# Patient Record
Sex: Male | Born: 1985 | Race: Black or African American | Hispanic: No | Smoking: Former smoker
Health system: Southern US, Community
[De-identification: ages and names within clinical notes are randomized; demographics above are authoritative.]

## PROBLEM LIST (undated history)

## (undated) DIAGNOSIS — B20 Human immunodeficiency virus [HIV] disease: Secondary | ICD-10-CM

## (undated) DIAGNOSIS — Z21 Asymptomatic human immunodeficiency virus [HIV] infection status: Secondary | ICD-10-CM

## (undated) HISTORY — DX: Human immunodeficiency virus (HIV) disease: B20

## (undated) HISTORY — DX: Asymptomatic human immunodeficiency virus (hiv) infection status: Z21

---

## 2014-08-04 DIAGNOSIS — K6282 Dysplasia of anus: Secondary | ICD-10-CM | POA: Insufficient documentation

## 2016-08-30 ENCOUNTER — Telehealth: Payer: Self-pay | Admitting: *Deleted

## 2016-08-30 NOTE — Telephone Encounter (Signed)
Patient has a scheduled appointment, however he is running low on meds and wanted to know what his options were (this was left on voice mail). Called patient back and left him a voicemail advising that he call his previous provider and request a short supply of his meds. Also advised him that I would route his message to our intake coordinator, Craig Morrowammy King, Craig Shah. Call back # [phone]. Craig MolaJacqueline Kellianne Shah

## 2016-09-18 ENCOUNTER — Other Ambulatory Visit (HOSPITAL_COMMUNITY)
Admission: RE | Admit: 2016-09-18 | Discharge: 2016-09-18 | Disposition: A | Discharge status: Home / Self Care | Payer: 59 | Source: Ambulatory Visit | Attending: Internal Medicine | Admitting: Internal Medicine

## 2016-09-18 ENCOUNTER — Other Ambulatory Visit: Payer: Self-pay

## 2016-09-18 DIAGNOSIS — Z113 Encounter for screening for infections with a predominantly sexual mode of transmission: Secondary | ICD-10-CM

## 2016-09-18 DIAGNOSIS — Z79899 Other long term (current) drug therapy: Secondary | ICD-10-CM

## 2016-09-18 DIAGNOSIS — B20 Human immunodeficiency virus [HIV] disease: Secondary | ICD-10-CM

## 2016-09-18 LAB — CBC WITH DIFFERENTIAL/PLATELET
BASOS PCT: 0 %
Basophils Absolute: 0 cells/uL (ref 0–200)
EOS ABS: 252 {cells}/uL (ref 15–500)
Eosinophils Relative: 4 %
HEMATOCRIT: 43.7 % (ref 38.5–50.0)
HEMOGLOBIN: 14.8 g/dL (ref 13.2–17.1)
LYMPHS PCT: 41 %
Lymphs Abs: 2583 cells/uL (ref 850–3900)
MCH: 30.1 pg (ref 27.0–33.0)
MCHC: 33.9 g/dL (ref 32.0–36.0)
MCV: 88.8 fL (ref 80.0–100.0)
MONO ABS: 630 {cells}/uL (ref 200–950)
MPV: 10.3 fL (ref 7.5–12.5)
Monocytes Relative: 10 %
Neutro Abs: 2835 cells/uL (ref 1500–7800)
Neutrophils Relative %: 45 %
Platelets: 185 10*3/uL (ref 140–400)
RBC: 4.92 MIL/uL (ref 4.20–5.80)
RDW: 13.4 % (ref 11.0–15.0)
WBC: 6.3 10*3/uL (ref 3.8–10.8)

## 2016-09-18 LAB — URINALYSIS
Bilirubin Urine: NEGATIVE
GLUCOSE, UA: NEGATIVE
HGB URINE DIPSTICK: NEGATIVE
KETONES UR: NEGATIVE
LEUKOCYTES UA: NEGATIVE
Nitrite: NEGATIVE
PROTEIN: NEGATIVE
Specific Gravity, Urine: 1.033 (ref 1.001–1.035)
pH: 6.5 (ref 5.0–8.0)

## 2016-09-18 LAB — COMPLETE METABOLIC PANEL WITH GFR
ALBUMIN: 4.4 g/dL (ref 3.6–5.1)
ALK PHOS: 55 U/L (ref 40–115)
ALT: 22 U/L (ref 9–46)
AST: 31 U/L (ref 10–40)
BILIRUBIN TOTAL: 0.5 mg/dL (ref 0.2–1.2)
BUN: 25 mg/dL (ref 7–25)
CALCIUM: 9.2 mg/dL (ref 8.6–10.3)
CO2: 25 mmol/L (ref 20–31)
CREATININE: 1.2 mg/dL (ref 0.60–1.35)
Chloride: 106 mmol/L (ref 98–110)
GFR, Est Non African American: 82 mL/min (ref >=60)
Glucose, Bld: 88 mg/dL (ref 65–99)
Potassium: 3.8 mmol/L (ref 3.5–5.3)
Sodium: 140 mmol/L (ref 135–146)
TOTAL PROTEIN: 7.2 g/dL (ref 6.1–8.1)

## 2016-09-18 LAB — LIPID PANEL
CHOL/HDL RATIO: 4.3 ratio (ref <5.0)
CHOLESTEROL: 119 mg/dL (ref <200)
HDL: 28 mg/dL — ABNORMAL LOW (ref >40)
LDL Cholesterol: 67 mg/dL (ref <100)
TRIGLYCERIDES: 122 mg/dL (ref <150)
VLDL: 24 mg/dL (ref <30)

## 2016-09-18 NOTE — Addendum Note (Signed)
Addended by: Mariea ClontsGREEN, Tonia Avino D on: 09/18/2016 10:14 AM   Modules accepted: Orders

## 2016-09-18 NOTE — Addendum Note (Signed)
Addended by: Mariea ClontsGREEN, Mikenzi Raysor D on: 09/18/2016 03:30 PM   Modules accepted: Orders

## 2016-09-18 NOTE — Addendum Note (Signed)
Addended by: Mariea ClontsGREEN, Marilynn Ekstein D on: 09/18/2016 05:51 PM   Modules accepted: Orders

## 2016-09-19 LAB — HEPATITIS B SURFACE ANTIBODY,QUALITATIVE: Hep B S Ab: POSITIVE — AB

## 2016-09-19 LAB — RPR: RPR: REACTIVE — AB

## 2016-09-19 LAB — HEPATITIS B CORE ANTIBODY, TOTAL: Hep B Core Total Ab: NONREACTIVE

## 2016-09-19 LAB — URINE CYTOLOGY ANCILLARY ONLY
CHLAMYDIA, DNA PROBE: NEGATIVE
Neisseria Gonorrhea: NEGATIVE

## 2016-09-19 LAB — HEPATITIS C ANTIBODY: HCV Ab: NEGATIVE

## 2016-09-19 LAB — HEPATITIS B SURFACE ANTIGEN: HEP B S AG: NEGATIVE

## 2016-09-19 LAB — RPR TITER: RPR Titer: 1:32 {titer} — AB

## 2016-09-19 LAB — HEPATITIS A ANTIBODY, TOTAL: HEP A TOTAL AB: NONREACTIVE

## 2016-09-19 LAB — HIV-1 RNA ULTRAQUANT REFLEX TO GENTYP+
HIV 1 RNA Quant: <20 copies/mL (ref <20)
HIV-1 RNA Quant, Log: <1.3 Log copies/mL (ref <1.30)

## 2016-09-19 LAB — FLUORESCENT TREPONEMAL AB(FTA)-IGG-BLD: Fluorescent Treponemal ABS: REACTIVE — AB

## 2016-09-20 LAB — QUANTIFERON TB GOLD ASSAY (BLOOD)
Interferon Gamma Release Assay: NEGATIVE
QUANTIFERON NIL VALUE: 0.04 [IU]/mL
QUANTIFERON TB AG MINUS NIL: 0.02 [IU]/mL

## 2016-09-20 LAB — T-HELPER CELL (CD4) - (RCID CLINIC ONLY)
CD4 T CELL HELPER: 38 % (ref 33–55)
CD4 T Cell Abs: 1040 /uL (ref 400–2700)

## 2016-09-21 ENCOUNTER — Telehealth: Payer: Self-pay | Admitting: *Deleted

## 2016-09-21 NOTE — Telephone Encounter (Signed)
Patient called and advised he is transferring here from [facility] and would like for out doctor to give him medication until his first visit 10/02/16. Asked the patient to call his former clinic and request refills he advised he spoke with nurse in this office that advised him he could get meds until his visit. Advised him usually not how this works but could call the doctor and our team lead and someone will give him a call back.

## 2016-09-27 LAB — HLA B*5701: HLA-B 5701 W/RFLX HLA-B HIGH: NEGATIVE

## 2016-10-02 ENCOUNTER — Other Ambulatory Visit: Payer: Self-pay | Admitting: *Deleted

## 2016-10-02 ENCOUNTER — Other Ambulatory Visit: Payer: Self-pay | Admitting: Pharmacist

## 2016-10-02 ENCOUNTER — Encounter: Payer: Self-pay | Admitting: Internal Medicine

## 2016-10-02 ENCOUNTER — Ambulatory Visit (INDEPENDENT_AMBULATORY_CARE_PROVIDER_SITE_OTHER): Payer: 59 | Admitting: Internal Medicine

## 2016-10-02 DIAGNOSIS — B20 Human immunodeficiency virus [HIV] disease: Secondary | ICD-10-CM | POA: Insufficient documentation

## 2016-10-02 DIAGNOSIS — B360 Pityriasis versicolor: Secondary | ICD-10-CM | POA: Diagnosis not present

## 2016-10-02 DIAGNOSIS — K6282 Dysplasia of anus: Secondary | ICD-10-CM

## 2016-10-02 DIAGNOSIS — A539 Syphilis, unspecified: Secondary | ICD-10-CM

## 2016-10-02 DIAGNOSIS — Z87891 Personal history of nicotine dependence: Secondary | ICD-10-CM

## 2016-10-02 MED ORDER — EMTRICITAB-RILPIVIR-TENOFOV AF 200-25-25 MG PO TABS: 1.0000 | ORAL_TABLET | Freq: Every day | ORAL | 11 refills | Status: DC

## 2016-10-02 MED ORDER — FLUCONAZOLE 100 MG PO TABS: 300.0000 mg | ORAL_TABLET | ORAL | 0 refills | Status: DC

## 2016-10-02 MED FILL — FLUCONAZOLE 100 MG TABLET: 100 | 14 days supply | Qty: 6 | Fill #0

## 2016-10-02 NOTE — Progress Notes (Signed)
Patient Active Problem List   Diagnosis Date Noted  . HIV disease (HCC) 10/02/2016    Priority: High  . Former smoker 10/02/2016  . Tinea versicolor 10/02/2016  . 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 Syphilis 10/02/2016    Patient's Medications  New Prescriptions   EMTRICITABINE-RILPIVIR-TENOFOVIR AF (ODEFSEY) 200-25-25 MG TABS TABLET    Take 1 tablet by mouth daily.  Previous Medications   No medications on file  Modified Medications   Modified Medication Previous Medication   FLUCONAZOLE (DIFLUCAN) 100 MG TABLET fluconazole (DIFLUCAN) 100 MG tablet      Take 3 tablets (300 mg total) by mouth once a week.    Take 3 tablets (300 mg total) by mouth once a week.  Discontinued Medications   EMTRICITABINE-RILPIVIR-TENOFOVIR DF (COMPLERA) 200-25-300 MG TABLET    Take 1 tablet by mouth at bedtime.    Subjective: Craig Shah is in for his initial visit to establish ongoing care for HIV infection. He was diagnosed in 2012 when he went in and asked her routine testing. He had been negative on all previous test. He believes he was infected through sex. He states that when he was younger he was quite promiscuous. He could not give me an estimate of the number of partners he's had in his lifetime. He has had both male and male partners. When he was diagnosed he made a decision to simply deal with it and [facility] and started on Complera shortly after his diagnosis. He recalls always being told that his viral load was undetectable and that he was doing well on therapy. His last viral load in February was elevated at 3349. He does not recall missing doses area his viral load done here earlier this month was back to undetectable at less than 20 with a CD4 count of 1040. Since learning of his infection he shared that information with 2 friends, including his current roommate [place]. He has not chosen to tell any family. He feels like he has good  support. He is not in a relationship currently and has not been sexually active in over one year. He has a history of previously treated syphilis but no other STDs that he is aware of. 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 He quit smoking cigarettes last year. He drinks alcohol only socially. He has smoked marijuana on occasion in the past but has not used any other drugs. He went through 2 years of college. 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 He denies any history of feeling depressed or anxious. He rarely if ever misses his Complera. However he ran out a week and a half ago and was not able to get a refill before his visit here today.  Review of Systems: Review of Systems  Constitutional: Negative for chills, diaphoresis, fever, malaise/fatigue and weight loss.  HENT: Negative for sore throat.   Respiratory: Negative for cough, sputum production and shortness of breath.   Cardiovascular: Negative for chest pain.  Gastrointestinal: Negative for diarrhea, nausea and vomiting.  Genitourinary: Negative for dysuria and frequency.  Musculoskeletal: Negative for joint pain and myalgias.  Skin: Negative for rash.  Neurological: Negative for dizziness and headaches.  Psychiatric/Behavioral: Negative for depression and substance abuse. The patient is not nervous/anxious.     Past Medical History:  Diagnosis Date  . HIV infection (HCC)  Social History  Substance Use Topics  . Smoking status: Former Smoker    Years: 8.00    Quit date: 05/12/2016  . Smokeless tobacco: Never Used  . Alcohol use Yes     Comment: occassional    Family History  Problem Relation Age of Onset  . Cancer Paternal Grandmother   . Colon cancer Paternal Grandmother     No Known Allergies  Objective:  Vitals:   10/02/16 1404  BP: 124/80  Pulse: (!) 51  Temp: 98 F (36.7 C)  TempSrc: Oral  Weight: 159 lb (72.1 kg)  Height: 5'  6" (1.676 m)   Body mass index is 25.66 kg/m.  Physical Exam  Constitutional: He is oriented to person, place, and time.  He is smiling and in good spirits.  HENT:  Mouth/Throat: No oropharyngeal exudate.  Eyes: Conjunctivae are normal.  Cardiovascular: Normal rate and regular rhythm.   No murmur heard. Pulmonary/Chest: Effort normal and breath sounds normal. He has no wheezes. He has no rales.  Abdominal: Soft. He exhibits no mass. There is no tenderness.  Musculoskeletal: Normal range of motion.  Neurological: He is alert and oriented to person, place, and time. Gait normal.  Skin: Rash noted.  He has scattered, circular areas of hypopigmentation on his arms and trunk compatible with tinea versicolor.  Psychiatric: Mood and affect normal.    Lab Results Lab Results  Component Value Date   WBC 6.3 09/18/2016   HGB 14.8 09/18/2016   HCT 43.7 09/18/2016   MCV 88.8 09/18/2016   PLT 185 09/18/2016    Lab Results  Component Value Date   CREATININE 1.20 09/18/2016   BUN 25 09/18/2016   NA 140 09/18/2016   K 3.8 09/18/2016   CL 106 09/18/2016   CO2 25 09/18/2016    Lab Results  Component Value Date   ALT 22 09/18/2016   AST 31 09/18/2016   ALKPHOS 55 09/18/2016   BILITOT 0.5 09/18/2016    Lab Results  Component Value Date   CHOL 119 09/18/2016   HDL 28 (L) 09/18/2016   LDLCALC 67 09/18/2016   TRIG 122 09/18/2016   CHOLHDL 4.3 09/18/2016   HIV 1 RNA Quant (copies/mL)  Date Value  09/18/2016 <20   CD4 T Cell Abs (/uL)  Date Value  09/18/2016 1,040     Problem List Items Addressed This Visit      High   HIV disease (HCC)    His infection is back under excellent control. His adherence is good. I will [facility]. He will follow-up after blood work in 3 months.        Unprioritized   Anal dysplasia    He has a history of anal CIN 1 and [facility]. He never made it to that appointment because he never received a call from the  clinic. We will work on making local arrangements for evaluation within the next few months.      Former smoker   Syphilis    His RPR has been consistently elevated since first checked at [facility] in 2015. He indicates that he has been treated years ago in [place] when he was a teenager. He says that he was also seen at another clinic (? Health department) within the past 2 months and was treated with 1 dose of penicillin. He recalls being told that he will always have some evidence of syphilis in his blood test. One note from [facility] in 2016 indicated that  they felt like he was probably serofast but his RPR is now up from 1:16 to 1:32. He denies being sexually active in the past year. He will have a repeat RPR before his visit in 3 months. 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      Tinea versicolor    I will treat him with oral fluconazole.      Relevant Orders   T-helper cell (CD4)- ([facility] clinic only)   HIV 1 RNA quant-no reflex-bld   CBC   Comprehensive metabolic panel   Lipid panel   RPR        Cliffton Asters, MD [facility] Services for Infectious Disease [facility] [phone] pager   [phone] cell 10/02/2016, 3:51 PM

## 2016-10-02 NOTE — Progress Notes (Signed)
HPI: Marykay LexCameron Engh is a 30 y.o. male who is here after his tx of [facility].   Allergies: No Known Allergies  Vitals: Temp: 98 F (36.7 C) (11/21 1404) Temp Source: Oral (11/21 1404) BP: 124/80 (11/21 1404) Pulse Rate: 51 (11/21 1404)  Past Medical History: Past Medical History:  Diagnosis Date  . HIV infection [facility](HCC)     Social History: Social History   Social History  . Marital status: Unknown    Spouse name: N/A  . Number of children: N/A  . Years of education: N/A   Social History Main Topics  . Smoking status: Former Smoker    Years: 8.00    Quit date: 05/12/2016  . Smokeless tobacco: Never Used  . Alcohol use Yes     Comment: occassional  . Drug use:     Frequency: 7.0 times per week    Types: Marijuana     Comment: recreational  . Sexual activity: Not Currently    Partners: Male     Comment: declined condoms   Other Topics Concern  . None   Social History Narrative  . None    Previous Regimen:   Current Regimen: Complera  Labs: HIV 1 RNA Quant (copies/mL)  Date Value  09/18/2016 <20   CD4 T Cell Abs (/uL)  Date Value  09/18/2016 1,040   Hep B S Ab (no units)  Date Value  09/18/2016 POS (A)   Hepatitis B Surface Ag (no units)  Date Value  09/18/2016 NEGATIVE   HCV Ab (no units)  Date Value  09/18/2016 NEGATIVE    CrCl: Estimated Creatinine Clearance: 82 mL/min (by C-G formula based on SCr of 1.2 mg/dL).  Lipids:    Component Value Date/Time   CHOL 119 09/18/2016 0919   TRIG 122 09/18/2016 0919   HDL 28 (L) 09/18/2016 0919   CHOLHDL 4.3 09/18/2016 0919   VLDL 24 09/18/2016 0919   LDLCALC 67 09/18/2016 0919    Assessment: Sheria LangCameron received his HIV care from [facility] in the past and is here for his initial visit today. He has been well managed on Complera. He ran out of meds about a week ago and couldn't get refill. He has private insurance now. We tried to [facility] his [facility] so we can keep track of it, however, [facility]  ins[facility]. HIs prescription has been sent there.   Recommendations:  Change [facility]  [place], [place] Quang, PharmD, BCPS, AAHIVP, CPP Clinical Infectious Disease Pharmacist [facility] 10/02/2016, 11:12 PM

## 2016-10-02 NOTE — Assessment & Plan Note (Signed)
He has a history of anal CIN 1 and was [facility]. He never made it to that appointment because he never received a call from the clinic. We will work on making local arrangements for evaluation within the next few months.

## 2016-10-02 NOTE — Assessment & Plan Note (Addendum)
I will treat him with oral fluconazole.

## 2016-10-02 NOTE — Addendum Note (Signed)
Addended by: Nicholes CalamityPHAM, MINH Q on: 10/02/2016 04:44 PM   Modules accepted: Orders

## 2016-10-02 NOTE — Assessment & Plan Note (Addendum)
His RPR has been consistently elevated since [facility]. He indicates that he has been treated years ago in [place] when he was a teenager. He says that he was also seen at another clinic (? Health department) within the past 2 months and was treated with 1 dose of penicillin. He recalls being told that he will always have some evidence of syphilis in his blood test. One note from [facility] in 2016 indicated that they felt like he was probably serofast but his RPR is now up from 1:16 to 1:32. He denies being sexually active in the past year. He will have a repeat RPR before his visit in 3 months. 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

## 2016-10-02 NOTE — Assessment & Plan Note (Signed)
His infection is back under excellent control. His adherence is good. I will change [facility] to [facility]. He will follow-up after blood work in 3 months.

## 2016-10-16 ENCOUNTER — Encounter: Payer: Self-pay | Admitting: *Deleted

## 2016-12-20 ENCOUNTER — Other Ambulatory Visit: Payer: 59

## 2016-12-20 DIAGNOSIS — B360 Pityriasis versicolor: Secondary | ICD-10-CM

## 2016-12-20 LAB — COMPREHENSIVE METABOLIC PANEL
ALK PHOS: 60 U/L (ref 40–115)
ALT: 16 U/L (ref 9–46)
AST: 23 U/L (ref 10–40)
Albumin: 4.1 g/dL (ref 3.6–5.1)
BILIRUBIN TOTAL: 0.4 mg/dL (ref 0.2–1.2)
BUN: 19 mg/dL (ref 7–25)
CALCIUM: 8.9 mg/dL (ref 8.6–10.3)
CO2: 29 mmol/L (ref 20–31)
CREATININE: 1.13 mg/dL (ref 0.60–1.35)
Chloride: 107 mmol/L (ref 98–110)
GLUCOSE: 98 mg/dL (ref 65–99)
Potassium: 4 mmol/L (ref 3.5–5.3)
SODIUM: 140 mmol/L (ref 135–146)
Total Protein: 7 g/dL (ref 6.1–8.1)

## 2016-12-20 LAB — CBC
HEMATOCRIT: 44.6 % (ref 38.5–50.0)
Hemoglobin: 15.1 g/dL (ref 13.2–17.1)
MCH: 30.3 pg (ref 27.0–33.0)
MCHC: 33.9 g/dL (ref 32.0–36.0)
MCV: 89.4 fL (ref 80.0–100.0)
MPV: 10.3 fL (ref 7.5–12.5)
PLATELETS: 202 10*3/uL (ref 140–400)
RBC: 4.99 MIL/uL (ref 4.20–5.80)
RDW: 13.5 % (ref 11.0–15.0)
WBC: 7.6 10*3/uL (ref 3.8–10.8)

## 2016-12-20 LAB — LIPID PANEL
Cholesterol: 101 mg/dL (ref <200)
HDL: 32 mg/dL — ABNORMAL LOW (ref >40)
LDL CALC: 52 mg/dL (ref <100)
Total CHOL/HDL Ratio: 3.2 Ratio (ref <5.0)
Triglycerides: 85 mg/dL (ref <150)
VLDL: 17 mg/dL (ref <30)

## 2016-12-20 MED FILL — ODEFSEY 200-25-25 MG TABS: 200-25-25 | 30 days supply | Qty: 30 | Fill #0

## 2016-12-21 LAB — T-HELPER CELL (CD4) - (RCID CLINIC ONLY)
CD4 % Helper T Cell: 34 % (ref 33–55)
CD4 T Cell Abs: 1270 /uL (ref 400–2700)

## 2016-12-21 LAB — RPR: RPR: REACTIVE — AB

## 2016-12-21 LAB — RPR TITER

## 2016-12-21 LAB — FLUORESCENT TREPONEMAL AB(FTA)-IGG-BLD: Fluorescent Treponemal ABS: REACTIVE — AB

## 2016-12-24 LAB — HIV-1 RNA QUANT-NO REFLEX-BLD
HIV 1 RNA Quant: <20 copies/mL — AB
HIV-1 RNA QUANT, LOG: DETECTED {Log_copies}/mL — AB

## 2017-01-02 ENCOUNTER — Encounter: Payer: Self-pay | Admitting: *Deleted

## 2017-01-03 ENCOUNTER — Encounter: Payer: Self-pay | Admitting: Internal Medicine

## 2017-01-03 ENCOUNTER — Ambulatory Visit (INDEPENDENT_AMBULATORY_CARE_PROVIDER_SITE_OTHER): Payer: 59 | Admitting: Internal Medicine

## 2017-01-03 DIAGNOSIS — K6282 Dysplasia of anus: Secondary | ICD-10-CM | POA: Diagnosis not present

## 2017-01-03 DIAGNOSIS — B20 Human immunodeficiency virus [HIV] disease: Secondary | ICD-10-CM

## 2017-01-03 DIAGNOSIS — A539 Syphilis, unspecified: Secondary | ICD-10-CM

## 2017-01-03 DIAGNOSIS — Z23 Encounter for immunization: Secondary | ICD-10-CM

## 2017-01-03 DIAGNOSIS — B360 Pityriasis versicolor: Secondary | ICD-10-CM | POA: Diagnosis not present

## 2017-01-03 MED ORDER — FLUCONAZOLE 100 MG PO TABS: 300.0000 mg | ORAL_TABLET | ORAL | 0 refills | Status: DC

## 2017-01-03 NOTE — Assessment & Plan Note (Signed)
His RPR is down to 1:16. 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

## 2017-01-03 NOTE — Assessment & Plan Note (Signed)
He was diagnosed with CIN-1 on an anal Pap prior to his transfer here last fall. He was [facility]. We will make a local referral for evaluation.

## 2017-01-03 NOTE — Addendum Note (Signed)
Addended by: Jennet MaduroESTRIDGE, DENISE D on: 01/03/2017 12:51 PM   Modules accepted: Orders

## 2017-01-03 NOTE — Addendum Note (Signed)
Addended by: Jennet MaduroESTRIDGE, Koren Plyler D on: 01/03/2017 02:46 PM   Modules accepted: Orders

## 2017-01-03 NOTE — Progress Notes (Signed)
Patient Active Problem List   Diagnosis Date Noted  . HIV disease (HCC) 10/02/2016    Priority: High  . Former smoker 10/02/2016  . Tinea versicolor 10/02/2016  . 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 Syphilis 10/02/2016  . 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  Modified Medications   Modified Medication Previous Medication   FLUCONAZOLE (DIFLUCAN) 100 MG TABLET fluconazole (DIFLUCAN) 100 MG tablet      Take 3 tablets (300 mg total) by mouth once a week.    Take 3 tablets (300 mg total) by mouth once a week.  Discontinued Medications   No medications on file    Subjective: Craig Shah is in for his routine HIV follow-up visit. After his initial visit last fall he switch from Complera to [facility]. He is tolerating it well. He missed 2-3 days when he had to change pharmacies but otherwise has not missed any other doses. He completed his 2 week course of fluconazole but noted only slight improvement in his tinea rash. He has not been sexually active since his last visit. He has a vacation coming up in March. He will be going to [place] for a dance competition.   Review of Systems: Review of Systems  Constitutional: Negative for chills, diaphoresis, fever, malaise/fatigue and weight loss.  HENT: Negative for sore throat.   Respiratory: Negative for cough, sputum production and shortness of breath.   Cardiovascular: Negative for chest pain.  Gastrointestinal: Negative for abdominal pain, diarrhea, heartburn, nausea and vomiting.  Genitourinary: Negative for dysuria and frequency.  Musculoskeletal: Negative for joint pain and myalgias.  Skin: Positive for rash. Negative for itching.  Neurological: Negative for dizziness and headaches.  Psychiatric/Behavioral: Negative for depression and  substance abuse. The patient is not nervous/anxious.     Past Medical History:  Diagnosis Date  . HIV infection [facility](HCC)     Social History  Substance Use Topics  . Smoking status: Former Smoker    Years: 8.00    Quit date: 05/12/2016  . Smokeless tobacco: Never Used  . Alcohol use Yes     Comment: occassional    Family History  Problem Relation Age of Onset  . Cancer Paternal Grandmother   . Colon cancer Paternal Grandmother   . Alcohol abuse Father   . Heart disease Father     No Known Allergies  Objective:  Vitals:   01/03/17 0849  BP: 118/78  Pulse: (!) 48  Temp: (!) 94.4 F (34.7 C)  TempSrc: Oral  Weight: 158 lb 8 oz (71.9 kg)  Height: 5\' 6"  (1.676 m)   Body mass index is 25.58 kg/m.  Physical Exam  Constitutional: He is oriented to person, place, and time.  He is very pleasant and in good spirits.  HENT:  Mouth/Throat: No oropharyngeal exudate.  His teeth are in excellent condition.  Eyes: Conjunctivae are normal.  Cardiovascular: Normal rate and regular rhythm.   No murmur heard. Pulmonary/Chest: Effort normal and breath sounds normal. He has no wheezes. He has no rales.  Abdominal: Soft. He exhibits no mass. There is no tenderness.  Musculoskeletal: Normal range of motion.  Neurological: He is alert and oriented to person, place, and time.  Skin: Rash noted.  There is no change in his scattered circular  slightly scaly spots on his trunk and arms.  Psychiatric: Mood and affect normal.    Lab Results Lab Results  Component Value Date   WBC 7.6 12/20/2016   HGB 15.1 12/20/2016   HCT 44.6 12/20/2016   MCV 89.4 12/20/2016   PLT 202 12/20/2016    Lab Results  Component Value Date   CREATININE 1.13 12/20/2016   BUN 19 12/20/2016   NA 140 12/20/2016   K 4.0 12/20/2016   CL 107 12/20/2016   CO2 29 12/20/2016    Lab Results  Component Value Date   ALT 16 12/20/2016   AST 23 12/20/2016   ALKPHOS 60 12/20/2016   BILITOT 0.4 12/20/2016      Lab Results  Component Value Date   CHOL 101 12/20/2016   HDL 32 (L) 12/20/2016   LDLCALC 52 12/20/2016   TRIG 85 12/20/2016   CHOLHDL 3.2 12/20/2016   HIV 1 RNA Quant (copies/mL)  Date Value  12/20/2016 <20 DETECTED (A)  09/18/2016 <20   CD4 T Cell Abs (/uL)  Date Value  12/20/2016 1,270  09/18/2016 1,040     Problem List Items Addressed This Visit      High   HIV disease (HCC)    His infection is under excellent, long-term control. He will continue Odefsey in follow-up after lab work in 6 months. He received his hepatitis A and meningococcal vaccines today.      Relevant Medications   fluconazole (DIFLUCAN) 100 MG tablet   Other Relevant Orders   T-helper cell (CD4)- ([facility] clinic only)   HIV 1 RNA quant-no reflex-bld   RPR     Unprioritized   Anal dysplasia    He was diagnosed with CIN-1 on an anal Pap prior to his transfer here last fall. He was never able to see the general surgeons at [facility]. We will make a local referral for evaluation.      Syphilis    His RPR is down to 1:16. 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      Relevant Medications   fluconazole (DIFLUCAN) 100 MG tablet   Tinea versicolor    I will retreat him with oral fluconazole and have him use Selsun Blue shampoo topically on a daily basis for 1-2 weeks.      Relevant Medications   fluconazole (DIFLUCAN) 100 MG tablet        Cliffton Asters, MD [facility] for Infectious Disease [facility] [phone] pager   [phone] cell 01/03/2017, 9:03 AM

## 2017-01-03 NOTE — Assessment & Plan Note (Signed)
I will retreat him with oral fluconazole and have him use Selsun Blue shampoo topically on a daily basis for 1-2 weeks.

## 2017-01-03 NOTE — Assessment & Plan Note (Signed)
His infection is under excellent, long-term control. He will continue Odefsey in follow-up after lab work in 6 months. He received his hepatitis A and meningococcal vaccines today.

## 2017-01-04 ENCOUNTER — Telehealth: Payer: Self-pay | Admitting: *Deleted

## 2017-01-04 ENCOUNTER — Other Ambulatory Visit: Payer: Self-pay | Admitting: *Deleted

## 2017-01-04 DIAGNOSIS — B20 Human immunodeficiency virus [HIV] disease: Secondary | ICD-10-CM

## 2017-01-04 DIAGNOSIS — B360 Pityriasis versicolor: Secondary | ICD-10-CM

## 2017-01-04 MED ORDER — FLUCONAZOLE 100 MG PO TABS: 300.0000 mg | ORAL_TABLET | ORAL | 0 refills | Status: DC

## 2017-01-04 NOTE — Telephone Encounter (Signed)
Called patient and left a voice mail to confirm pharmacy. [facility] are out of network and [facility]. [facility] sent

## 2017-01-16 MED FILL — ODEFSEY 200-25-25 MG TABS: 200-25-25 | 30 days supply | Qty: 30 | Fill #1

## 2017-02-19 MED FILL — ODEFSEY 200-25-25 MG TABS: 200-25-25 | 30 days supply | Qty: 30 | Fill #2

## 2017-03-21 MED FILL — ODEFSEY 200-25-25 MG TABS: 200-25-25 | 30 days supply | Qty: 30 | Fill #3

## 2017-04-24 MED FILL — ODEFSEY 200-25-25 MG TABS: 200-25-25 | 30 days supply | Qty: 30 | Fill #4

## 2017-05-22 MED FILL — ODEFSEY 200-25-25 MG TABS: 200-25-25 | 30 days supply | Qty: 30 | Fill #5

## 2017-06-07 ENCOUNTER — Other Ambulatory Visit: Payer: Self-pay | Admitting: Pharmacist

## 2017-06-21 MED FILL — ODEFSEY 200-25-25 MG TABS: 200-25-25 | 30 days supply | Qty: 30 | Fill #6

## 2017-07-24 MED FILL — ODEFSEY 200-25-25 MG TABS: 200-25-25 | 30 days supply | Qty: 30 | Fill #7

## 2017-07-30 ENCOUNTER — Ambulatory Visit (INDEPENDENT_AMBULATORY_CARE_PROVIDER_SITE_OTHER): Payer: 59 | Admitting: Internal Medicine

## 2017-07-30 ENCOUNTER — Encounter: Payer: Self-pay | Admitting: Internal Medicine

## 2017-07-30 DIAGNOSIS — B360 Pityriasis versicolor: Secondary | ICD-10-CM

## 2017-07-30 DIAGNOSIS — K0889 Other specified disorders of teeth and supporting structures: Secondary | ICD-10-CM | POA: Diagnosis not present

## 2017-07-30 DIAGNOSIS — B20 Human immunodeficiency virus [HIV] disease: Secondary | ICD-10-CM

## 2017-07-30 NOTE — Progress Notes (Signed)
Patient Active Problem List   Diagnosis Date Noted  . HIV disease (HCC) 10/02/2016    Priority: High  . Former smoker 10/02/2016  . Tinea versicolor 10/02/2016  . 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 Syphilis 10/02/2016  . 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   FLUCONAZOLE (DIFLUCAN) 100 MG TABLET    Take 3 tablets (300 mg total) by mouth once a week.  Modified Medications   No medications on file  Discontinued Medications   No medications on file    Subjective: Craig Shah is in for his routine HIV follow-up visit. He has had no problems obtaining, taking or tolerating his Odefsey and does not recall having missed doses. He takes it each evening before bed. 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 He is having some pain in front of his right mandibular molars recently. He does not get any regular exercise outside of work. He is not in a relationship and has not been sexually active since his last visit. He was seen by Dr. Romie Levee at [facility] surgery for his history of anal dysplasia and he tells me that he had a negative exam. He was told to follow-up as needed.  Review of Systems: Review of Systems  Constitutional: Negative for chills, diaphoresis, fever, malaise/fatigue and weight loss.  HENT: Negative for sore throat.        As noted in history of present illness.  Respiratory: Negative for cough, sputum production and shortness of breath.   Cardiovascular: Negative for chest pain.  Gastrointestinal: Negative for abdominal pain, diarrhea, heartburn, nausea and vomiting.  Genitourinary: Negative for dysuria and frequency.  Musculoskeletal: Negative for joint pain and myalgias.  Skin: Negative for rash.  Neurological: Negative  for dizziness and headaches.  Psychiatric/Behavioral: Negative for depression and substance abuse. The patient is not nervous/anxious.     Past Medical History:  Diagnosis Date  . HIV infection [facility])     Social History  Substance Use Topics  . Smoking status: Former Smoker    Years: 8.00    Quit date: 05/12/2016  . Smokeless tobacco: Never Used  . Alcohol use Yes     Comment: occassional    Family History  Problem Relation Age of Onset  . Cancer Paternal Grandmother   . Colon cancer Paternal Grandmother   . Alcohol abuse Father   . Heart disease Father     No Known Allergies  Objective:  Vitals:   07/30/17 0851  BP: 111/73  Pulse: (!) 57  Temp: 97.7 F (36.5 C)  Weight: 158 lb (71.7 kg)   Body mass index is 25.5 kg/m.  Physical Exam  Constitutional: He is oriented to person, place, and time.  He is in good spirits.  HENT:  Mouth/Throat: No oropharyngeal exudate.  No obvious dental problems.  Eyes: Conjunctivae are normal.  Cardiovascular: Normal rate and regular rhythm.   No murmur heard. Pulmonary/Chest: Effort normal and breath sounds normal.  Abdominal: Soft. He exhibits no mass. There is no tenderness.  Musculoskeletal: Normal range of motion.  Neurological: He is alert and oriented to person, place, and time.  Skin: No rash noted.  Psychiatric: Mood and affect normal.    Lab Results Lab Results  Component Value Date   WBC 7.6 12/20/2016   HGB 15.1 12/20/2016   HCT 44.6 12/20/2016   MCV 89.4 12/20/2016   PLT 202 12/20/2016    Lab Results  Component Value Date   CREATININE 1.13 12/20/2016   BUN 19 12/20/2016   NA 140 12/20/2016   K 4.0 12/20/2016   CL 107 12/20/2016   CO2 29 12/20/2016    Lab Results  Component Value Date   ALT 16 12/20/2016   AST 23 12/20/2016   ALKPHOS 60 12/20/2016   BILITOT 0.4 12/20/2016    Lab Results  Component Value Date   CHOL 101 12/20/2016   HDL 32 (L) 12/20/2016   LDLCALC 52 12/20/2016   TRIG 85  12/20/2016   CHOLHDL 3.2 12/20/2016   Lab Results  Component Value Date   LABRPR REACTIVE (A) 12/20/2016   RPRTITER 1:16 (A) 12/20/2016   HIV 1 RNA Quant (copies/mL)  Date Value  12/20/2016 <20 DETECTED (A)  09/18/2016 <20   CD4 T Cell Abs (/uL)  Date Value  12/20/2016 1,270  09/18/2016 1,040     Problem List Items Addressed This Visit      High   HIV disease (HCC)   Relevant Orders   T-helper cell (CD4)- ([facility] clinic only)   HIV 1 RNA quant-no reflex-bld        Cliffton Asters, MD [facility] [phone] pager   [phone] cell 07/30/2017, 9:08 AM

## 2017-07-30 NOTE — Assessment & Plan Note (Signed)
His adherence is very good and his infection has been under excellent, long-term control. He will get lab work today and continue [employer]. He will follow-up in one year.

## 2017-07-30 NOTE — Assessment & Plan Note (Signed)
We will put him on the dental clinic waiting list.

## 2017-07-30 NOTE — Assessment & Plan Note (Signed)
His rash improved after retreatment with fluconazole and use of Selsun Blue.

## 2017-07-31 LAB — T-HELPER CELL (CD4) - (RCID CLINIC ONLY)
CD4 T CELL ABS: 1210 /uL (ref 400–2700)
CD4 T CELL HELPER: 39 % (ref 33–55)

## 2017-08-01 LAB — HIV-1 RNA QUANT-NO REFLEX-BLD
HIV 1 RNA QUANT: NOT DETECTED {copies}/mL
HIV-1 RNA Quant, Log: <1.3 Log copies/mL

## 2017-09-04 MED FILL — ODEFSEY 200-25-25 MG TABS: 200-25-25 | 30 days supply | Qty: 30 | Fill #8

## 2017-10-02 ENCOUNTER — Other Ambulatory Visit: Payer: Self-pay | Admitting: Internal Medicine

## 2017-10-02 DIAGNOSIS — B360 Pityriasis versicolor: Secondary | ICD-10-CM

## 2017-10-02 MED FILL — ODEFSEY 200-25-25 MG TABS: 200-25-25 | 30 days supply | Qty: 30 | Fill #0

## 2017-10-31 MED FILL — ODEFSEY 200-25-25 MG TABS: 200-25-25 | 30 days supply | Qty: 30 | Fill #1

## 2017-12-02 MED FILL — ODEFSEY 200-25-25 MG TABS: 200-25-25 | 30 days supply | Qty: 30 | Fill #2

## 2018-01-06 MED FILL — ODEFSEY 200-25-25 MG TABS: 200-25-25 | 30 days supply | Qty: 30 | Fill #3

## 2018-03-21 ENCOUNTER — Other Ambulatory Visit: Payer: Self-pay | Admitting: Pharmacist

## 2018-03-21 DIAGNOSIS — B360 Pityriasis versicolor: Secondary | ICD-10-CM

## 2018-03-21 NOTE — Progress Notes (Signed)
Patient is doing well on Odefsey. No missed doses or side effects.

## 2018-05-08 MED FILL — ODEFSEY 200-25-25 MG TABS: 200-25-25 | 30 days supply | Qty: 30 | Fill #4

## 2018-06-05 MED FILL — ODEFSEY 200-25-25 MG TABS: 200-25-25 | 30 days supply | Qty: 30 | Fill #5

## 2018-06-30 ENCOUNTER — Other Ambulatory Visit: Payer: Self-pay | Admitting: Internal Medicine

## 2018-06-30 DIAGNOSIS — B360 Pityriasis versicolor: Secondary | ICD-10-CM

## 2018-07-02 MED FILL — ODEFSEY 200-25-25 MG TABS: 200-25-25 | 30 days supply | Qty: 30 | Fill #0

## 2018-07-24 ENCOUNTER — Other Ambulatory Visit: Payer: Self-pay | Admitting: Internal Medicine

## 2018-07-24 DIAGNOSIS — B360 Pityriasis versicolor: Secondary | ICD-10-CM

## 2018-07-30 ENCOUNTER — Other Ambulatory Visit (HOSPITAL_COMMUNITY)
Admission: RE | Admit: 2018-07-30 | Discharge: 2018-07-30 | Disposition: A | Discharge status: Home / Self Care | Payer: 59 | Source: Ambulatory Visit | Attending: Internal Medicine | Admitting: Internal Medicine

## 2018-07-30 ENCOUNTER — Encounter: Payer: Self-pay | Admitting: Internal Medicine

## 2018-07-30 ENCOUNTER — Ambulatory Visit (INDEPENDENT_AMBULATORY_CARE_PROVIDER_SITE_OTHER): Payer: 59 | Admitting: Internal Medicine

## 2018-07-30 VITALS — BP 126/85 | HR 44 | Temp 98.3°F | Ht 66.0 in | Wt 157.0 lb

## 2018-07-30 DIAGNOSIS — Z23 Encounter for immunization: Secondary | ICD-10-CM

## 2018-07-30 DIAGNOSIS — A539 Syphilis, unspecified: Secondary | ICD-10-CM | POA: Diagnosis not present

## 2018-07-30 DIAGNOSIS — B20 Human immunodeficiency virus [HIV] disease: Secondary | ICD-10-CM

## 2018-07-30 DIAGNOSIS — K6282 Dysplasia of anus: Secondary | ICD-10-CM

## 2018-07-30 NOTE — Addendum Note (Signed)
Addended by: Andree CossHOWELL, Haris Baack M on: 07/30/2018 05:45 PM   Modules accepted: Orders

## 2018-07-30 NOTE — Progress Notes (Signed)
Patient Active Problem List   Diagnosis Date Noted  . HIV disease (HCC) 10/02/2016    Priority: High  . Pain, dental 07/30/2017  . Former smoker 10/02/2016  . Tinea versicolor 10/02/2016  . 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 Syphilis 10/02/2016  . 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  Modified Medications   No medications on file  Discontinued Medications   FLUCONAZOLE (DIFLUCAN) 100 MG TABLET    Take 3 tablets (300 mg total) by mouth once a week.    Subjective: Arpan is in for his routine HIV follow-up visit.  He has not had any problems obtaining, taking or tolerating his Odefsey.  He takes it each evening when he gets home from work.  He has not missed any doses.  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  He is not in a current relationship but has been sexually active with 3 male partners in the past year.  He says they use condoms consistently.  He was seen in the dental clinic after his visit last year and was told that his teeth are in very good condition.  He is no longer having any dental pain.  He has not followed up with Dr. Romie Levee in the past year for his anal dysplasia.  Review of Systems: Review of Systems  Constitutional: Negative for chills, diaphoresis, fever, malaise/fatigue and weight loss.  HENT: Negative for sore throat.   Respiratory: Negative for cough, sputum production and shortness of breath.   Cardiovascular: Negative for chest pain.  Gastrointestinal: Negative for abdominal pain, diarrhea, heartburn, nausea and vomiting.  Genitourinary: Negative for dysuria and frequency.  Musculoskeletal: Negative for joint pain and myalgias.  Skin: Negative for rash.  Neurological: Negative for dizziness and headaches.  Psychiatric/Behavioral: Negative for depression  and substance abuse. The patient is not nervous/anxious.     Past Medical History:  Diagnosis Date  . HIV infection (HCC)     Social History   Tobacco Use  . Smoking status: Former Smoker    Years: 8.00    Last attempt to quit: 05/12/2016    Years since quitting: 2.2  . Smokeless tobacco: Never Used  Substance Use Topics  . Alcohol use: Yes    Comment: occassional  . Drug use: Yes    Frequency: 7.0 times per week    Types: Marijuana    Comment: recreational    Family History  Problem Relation Age of Onset  . Cancer Paternal Grandmother   . Colon cancer Paternal Grandmother   . Alcohol abuse Father   . Heart disease Father     No Known Allergies  Health Maintenance  Topic Date Due  . INFLUENZA VACCINE  06/12/2018  . TETANUS/TDAP  11/18/2024  . HIV Screening  Completed    Objective:  Vitals:   07/30/18 0841  BP: 126/85  Pulse: (!) 44  Temp: 98.3 F (36.8 C)  Weight: 157 lb (71.2 kg)  Height: 5\' 6"  (1.676 m)   Body mass index is 25.34 kg/m.  Physical Exam  Constitutional: He is oriented to person, place, and time.  He is in good spirits.  HENT:  Mouth/Throat: No oropharyngeal exudate.  Teeth are in very good condition.  Eyes: Conjunctivae are normal.  Cardiovascular:  Normal rate and regular rhythm.  No murmur heard. Pulmonary/Chest: Effort normal and breath sounds normal.  Abdominal: Soft. He exhibits no mass. There is no tenderness.  Musculoskeletal: Normal range of motion.  Neurological: He is alert and oriented to person, place, and time.  Skin: No rash noted.  Psychiatric: He has a normal mood and affect.    Lab Results Lab Results  Component Value Date   WBC 7.6 12/20/2016   HGB 15.1 12/20/2016   HCT 44.6 12/20/2016   MCV 89.4 12/20/2016   PLT 202 12/20/2016    Lab Results  Component Value Date   CREATININE 1.13 12/20/2016   BUN 19 12/20/2016   NA 140 12/20/2016   K 4.0 12/20/2016   CL 107 12/20/2016   CO2 29 12/20/2016    Lab  Results  Component Value Date   ALT 16 12/20/2016   AST 23 12/20/2016   ALKPHOS 60 12/20/2016   BILITOT 0.4 12/20/2016    Lab Results  Component Value Date   CHOL 101 12/20/2016   HDL 32 (L) 12/20/2016   LDLCALC 52 12/20/2016   TRIG 85 12/20/2016   CHOLHDL 3.2 12/20/2016   Lab Results  Component Value Date   LABRPR REACTIVE (A) 12/20/2016   RPRTITER 1:16 (A) 12/20/2016   HIV 1 RNA Quant (copies/mL)  Date Value  07/30/2017 <20 NOT DETECTED  12/20/2016 <20 DETECTED (A)  09/18/2016 <20   CD4 T Cell Abs (/uL)  Date Value  07/30/2017 1,210  12/20/2016 1,270  09/18/2016 1,040     Problem List Items Addressed This Visit      High   HIV disease (HCC)    His adherence is perfect and his infection has been under excellent control.  He will get lab work today, continue Odefsey and follow-up in 1 year.  He will receive hepatitis A, influenza and meningococcal vaccines.      Relevant Orders   T-helper cell (CD4)- ([facility] clinic only)   HIV-1 RNA quant-no reflex-bld   CBC   Comprehensive metabolic panel   RPR   Lipid panel   Urine cytology ancillary only   Cytology (oral, anal, urethral) ancillary only   Cytology (oral, anal, urethral) ancillary only     Unprioritized   Anal dysplasia    We will see if we can get Dr. Maurine Ministerhomas's records and arrange follow-up for him.      Syphilis    Repeat his RPR today and also screen him for GC and chlamydia.  I talked to him about the importance of limiting the number of partners he has, choosing his partner is very carefully, having them tested and using condoms.           Cliffton AstersJohn Hildy Nicholl, MD [facility] [phone] pager   [phone] cell 07/30/2018, 8:55 AM

## 2018-07-30 NOTE — Assessment & Plan Note (Signed)
His adherence is perfect and his infection has been under excellent control.  He will get lab work today, continue Odefsey and follow-up in 1 year.  He will receive hepatitis A, influenza and meningococcal vaccines.

## 2018-07-30 NOTE — Assessment & Plan Note (Signed)
We will see if we can get Dr. Maurine Ministerhomas's records and arrange follow-up for him.

## 2018-07-30 NOTE — Assessment & Plan Note (Signed)
Repeat his RPR today and also screen him for GC and chlamydia.  I talked to him about the importance of limiting the number of partners he has, choosing his partner is very carefully, having them tested and using condoms.

## 2018-07-31 ENCOUNTER — Ambulatory Visit: Payer: 59 | Admitting: Internal Medicine

## 2018-07-31 LAB — CYTOLOGY, (ORAL, ANAL, URETHRAL) ANCILLARY ONLY
CHLAMYDIA, DNA PROBE: NEGATIVE
Chlamydia: POSITIVE — AB
NEISSERIA GONORRHEA: NEGATIVE
Neisseria Gonorrhea: NEGATIVE

## 2018-07-31 LAB — URINE CYTOLOGY ANCILLARY ONLY
CHLAMYDIA, DNA PROBE: NEGATIVE
NEISSERIA GONORRHEA: NEGATIVE

## 2018-07-31 LAB — T-HELPER CELL (CD4) - (RCID CLINIC ONLY)
CD4 T CELL HELPER: 35 % (ref 33–55)
CD4 T Cell Abs: 780 /uL (ref 400–2700)

## 2018-08-02 LAB — COMPREHENSIVE METABOLIC PANEL
AG RATIO: 1.6 (calc) (ref 1.0–2.5)
ALT: 21 U/L (ref 9–46)
AST: 23 U/L (ref 10–40)
Albumin: 4.6 g/dL (ref 3.6–5.1)
Alkaline phosphatase (APISO): 56 U/L (ref 40–115)
BUN: 21 mg/dL (ref 7–25)
CHLORIDE: 105 mmol/L (ref 98–110)
CO2: 25 mmol/L (ref 20–32)
CREATININE: 1.19 mg/dL (ref 0.60–1.35)
Calcium: 9.5 mg/dL (ref 8.6–10.3)
GLOBULIN: 2.9 g/dL (ref 1.9–3.7)
GLUCOSE: 99 mg/dL (ref 65–99)
POTASSIUM: 4.2 mmol/L (ref 3.5–5.3)
SODIUM: 139 mmol/L (ref 135–146)
TOTAL PROTEIN: 7.5 g/dL (ref 6.1–8.1)
Total Bilirubin: 0.7 mg/dL (ref 0.2–1.2)

## 2018-08-02 LAB — CBC
HCT: 45.1 % (ref 38.5–50.0)
HEMOGLOBIN: 15.4 g/dL (ref 13.2–17.1)
MCH: 30.3 pg (ref 27.0–33.0)
MCHC: 34.1 g/dL (ref 32.0–36.0)
MCV: 88.6 fL (ref 80.0–100.0)
MPV: 10.9 fL (ref 7.5–12.5)
PLATELETS: 215 10*3/uL (ref 140–400)
RBC: 5.09 10*6/uL (ref 4.20–5.80)
RDW: 12.4 % (ref 11.0–15.0)
WBC: 6.6 10*3/uL (ref 3.8–10.8)

## 2018-08-02 LAB — LIPID PANEL
CHOL/HDL RATIO: 3.1 (calc) (ref <5.0)
Cholesterol: 118 mg/dL (ref <200)
HDL: 38 mg/dL — AB (ref >40)
LDL Cholesterol (Calc): 67 mg/dL (calc)
NON-HDL CHOLESTEROL (CALC): 80 mg/dL (ref <130)
Triglycerides: 54 mg/dL (ref <150)

## 2018-08-02 LAB — HIV-1 RNA QUANT-NO REFLEX-BLD
HIV 1 RNA Quant: <20 copies/mL
HIV-1 RNA Quant, Log: <1.3 Log copies/mL

## 2018-08-02 LAB — RPR: RPR Ser Ql: REACTIVE — AB

## 2018-08-02 LAB — RPR TITER

## 2018-08-02 LAB — FLUORESCENT TREPONEMAL AB(FTA)-IGG-BLD: Fluorescent Treponemal ABS: REACTIVE — AB

## 2018-08-04 MED FILL — ODEFSEY 200-25-25 MG TABS: 200-25-25 | 30 days supply | Qty: 30 | Fill #0

## 2018-08-19 ENCOUNTER — Ambulatory Visit (INDEPENDENT_AMBULATORY_CARE_PROVIDER_SITE_OTHER): Payer: 59 | Admitting: Behavioral Health

## 2018-08-19 ENCOUNTER — Other Ambulatory Visit: Payer: Self-pay | Admitting: Internal Medicine

## 2018-08-19 ENCOUNTER — Encounter: Payer: Self-pay | Admitting: Internal Medicine

## 2018-08-19 DIAGNOSIS — A749 Chlamydial infection, unspecified: Secondary | ICD-10-CM

## 2018-08-19 DIAGNOSIS — A64 Unspecified sexually transmitted disease: Secondary | ICD-10-CM | POA: Diagnosis not present

## 2018-08-19 DIAGNOSIS — Z23 Encounter for immunization: Secondary | ICD-10-CM | POA: Diagnosis not present

## 2018-08-19 DIAGNOSIS — B20 Human immunodeficiency virus [HIV] disease: Secondary | ICD-10-CM | POA: Diagnosis not present

## 2018-08-19 MED ORDER — AZITHROMYCIN 500 MG PO TABS: 1000.0000 mg | ORAL_TABLET | Freq: Once | ORAL | 0 refills | Status: DC

## 2018-08-19 MED ORDER — AZITHROMYCIN 250 MG PO TABS
1000.0000 mg | ORAL_TABLET | Freq: Once | ORAL | Status: AC
  Administered 2018-08-19: 1000 mg via ORAL

## 2018-08-19 NOTE — Progress Notes (Signed)
Patient tolerated oral Azithromycin well.  Patient also tolerated menveo and Prevanr vaccines well.  Patient offered condoms and he accepted.  Educated on refraining from sexually activity for about 2 weeks.  Patient verbalized understanding.  Patient states he will inform previous partners to be tested and treated. Angeline Slim RN

## 2018-08-26 ENCOUNTER — Other Ambulatory Visit: Payer: Self-pay | Admitting: Internal Medicine

## 2018-08-26 DIAGNOSIS — B360 Pityriasis versicolor: Secondary | ICD-10-CM

## 2018-08-28 MED FILL — ODEFSEY 200-25-25 MG TABS: 200-25-25 | 30 days supply | Qty: 30 | Fill #0

## 2018-09-29 MED FILL — ODEFSEY 200-25-25 MG TABS: 200-25-25 | 30 days supply | Qty: 30 | Fill #1

## 2018-10-30 MED FILL — ODEFSEY 200-25-25 MG TABS: 200-25-25 | 30 days supply | Qty: 30 | Fill #2

## 2018-11-27 MED FILL — ODEFSEY 200-25-25 MG TABS: 200-25-25 | 30 days supply | Qty: 30 | Fill #3

## 2018-12-18 ENCOUNTER — Other Ambulatory Visit: Payer: Self-pay | Admitting: Internal Medicine

## 2018-12-18 DIAGNOSIS — B360 Pityriasis versicolor: Secondary | ICD-10-CM

## 2018-12-24 MED FILL — ODEFSEY 200-25-25 MG TABS: 200-25-25 | 30 days supply | Qty: 30 | Fill #0

## 2019-01-22 MED FILL — ODEFSEY 200-25-25 MG TABS: 200-25-25 | 30 days supply | Qty: 30 | Fill #1

## 2019-02-23 MED FILL — ODEFSEY 200-25-25 MG TABS: 200-25-25 | 30 days supply | Qty: 30 | Fill #2

## 2019-03-24 MED FILL — ODEFSEY 200-25-25 MG TABS: 200-25-25 | 30 days supply | Qty: 30 | Fill #3

## 2019-04-23 MED FILL — ODEFSEY 200-25-25 MG TABS: 200-25-25 | 30 days supply | Qty: 30 | Fill #4

## 2019-05-25 MED FILL — ODEFSEY 200-25-25 MG TABS: 200-25-25 | 30 days supply | Qty: 30 | Fill #5

## 2019-06-18 ENCOUNTER — Other Ambulatory Visit: Payer: Self-pay | Admitting: Internal Medicine

## 2019-06-18 DIAGNOSIS — B360 Pityriasis versicolor: Secondary | ICD-10-CM

## 2019-06-24 MED FILL — ODEFSEY 200-25-25 MG TABS: 200-25-25 | 30 days supply | Qty: 30 | Fill #0

## 2019-08-05 ENCOUNTER — Ambulatory Visit: Payer: 59 | Admitting: Internal Medicine

## 2019-08-26 ENCOUNTER — Emergency Department (HOSPITAL_COMMUNITY): Payer: No Typology Code available for payment source

## 2019-08-26 ENCOUNTER — Encounter (HOSPITAL_COMMUNITY): Payer: Self-pay | Admitting: Emergency Medicine

## 2019-08-26 ENCOUNTER — Other Ambulatory Visit: Payer: Self-pay

## 2019-08-26 ENCOUNTER — Observation Stay (HOSPITAL_COMMUNITY)
Admission: EM | Admit: 2019-08-26 | Discharge: 2019-08-27 | Disposition: A | Discharge status: Home / Self Care | Payer: No Typology Code available for payment source | Attending: Neurosurgery | Admitting: Neurosurgery

## 2019-08-26 DIAGNOSIS — Z21 Asymptomatic human immunodeficiency virus [HIV] infection status: Secondary | ICD-10-CM | POA: Insufficient documentation

## 2019-08-26 DIAGNOSIS — S199XXA Unspecified injury of neck, initial encounter: Secondary | ICD-10-CM | POA: Diagnosis present

## 2019-08-26 DIAGNOSIS — Y92410 Unspecified street and highway as the place of occurrence of the external cause: Secondary | ICD-10-CM | POA: Diagnosis not present

## 2019-08-26 DIAGNOSIS — S0083XA Contusion of other part of head, initial encounter: Secondary | ICD-10-CM | POA: Diagnosis not present

## 2019-08-26 DIAGNOSIS — Z20828 Contact with and (suspected) exposure to other viral communicable diseases: Secondary | ICD-10-CM | POA: Diagnosis not present

## 2019-08-26 DIAGNOSIS — Z87891 Personal history of nicotine dependence: Secondary | ICD-10-CM | POA: Diagnosis not present

## 2019-08-26 DIAGNOSIS — R410 Disorientation, unspecified: Secondary | ICD-10-CM | POA: Diagnosis not present

## 2019-08-26 DIAGNOSIS — R001 Bradycardia, unspecified: Secondary | ICD-10-CM | POA: Diagnosis not present

## 2019-08-26 DIAGNOSIS — S12591A Other nondisplaced fracture of sixth cervical vertebra, initial encounter for closed fracture: Secondary | ICD-10-CM | POA: Diagnosis not present

## 2019-08-26 DIAGNOSIS — Y999 Unspecified external cause status: Secondary | ICD-10-CM | POA: Diagnosis not present

## 2019-08-26 DIAGNOSIS — S129XXA Fracture of neck, unspecified, initial encounter: Secondary | ICD-10-CM | POA: Diagnosis present

## 2019-08-26 DIAGNOSIS — Y9389 Activity, other specified: Secondary | ICD-10-CM | POA: Insufficient documentation

## 2019-08-26 DIAGNOSIS — S12190A Other displaced fracture of second cervical vertebra, initial encounter for closed fracture: Principal | ICD-10-CM | POA: Insufficient documentation

## 2019-08-26 DIAGNOSIS — S0012XA Contusion of left eyelid and periocular area, initial encounter: Secondary | ICD-10-CM | POA: Diagnosis not present

## 2019-08-26 DIAGNOSIS — Z79899 Other long term (current) drug therapy: Secondary | ICD-10-CM | POA: Insufficient documentation

## 2019-08-26 LAB — ETHANOL: Alcohol, Ethyl (B): <10 mg/dL (ref <10)

## 2019-08-26 LAB — URINALYSIS, ROUTINE W REFLEX MICROSCOPIC
Bacteria, UA: NONE SEEN
Bilirubin Urine: NEGATIVE
Glucose, UA: NEGATIVE mg/dL
Hgb urine dipstick: NEGATIVE
Ketones, ur: NEGATIVE mg/dL
Leukocytes,Ua: NEGATIVE
Nitrite: NEGATIVE
Protein, ur: 30 mg/dL — AB
Specific Gravity, Urine: 1.036 — ABNORMAL HIGH (ref 1.005–1.030)
pH: 6 (ref 5.0–8.0)

## 2019-08-26 LAB — I-STAT CHEM 8, ED
BUN: 17 mg/dL (ref 6–20)
Calcium, Ion: 1.11 mmol/L — ABNORMAL LOW (ref 1.15–1.40)
Chloride: 101 mmol/L (ref 98–111)
Creatinine, Ser: 1 mg/dL (ref 0.61–1.24)
Glucose, Bld: 100 mg/dL — ABNORMAL HIGH (ref 70–99)
HCT: 47 % (ref 39.0–52.0)
Hemoglobin: 16 g/dL (ref 13.0–17.0)
Potassium: 3.9 mmol/L (ref 3.5–5.1)
Sodium: 140 mmol/L (ref 135–145)
TCO2: 26 mmol/L (ref 22–32)

## 2019-08-26 LAB — COMPREHENSIVE METABOLIC PANEL
ALT: 33 U/L (ref 0–44)
AST: 34 U/L (ref 15–41)
Albumin: 4.3 g/dL (ref 3.5–5.0)
Alkaline Phosphatase: 60 U/L (ref 38–126)
Anion gap: 11 (ref 5–15)
BUN: 12 mg/dL (ref 6–20)
CO2: 25 mmol/L (ref 22–32)
Calcium: 9 mg/dL (ref 8.9–10.3)
Chloride: 101 mmol/L (ref 98–111)
Creatinine, Ser: 1.17 mg/dL (ref 0.61–1.24)
GFR calc Af Amer: >60 mL/min (ref >60)
GFR calc non Af Amer: >60 mL/min (ref >60)
Glucose, Bld: 105 mg/dL — ABNORMAL HIGH (ref 70–99)
Potassium: 3.5 mmol/L (ref 3.5–5.1)
Sodium: 137 mmol/L (ref 135–145)
Total Bilirubin: 0.8 mg/dL (ref 0.3–1.2)
Total Protein: 7.4 g/dL (ref 6.5–8.1)

## 2019-08-26 LAB — PROTIME-INR
INR: 1.1 (ref 0.8–1.2)
Prothrombin Time: 13.7 seconds (ref 11.4–15.2)

## 2019-08-26 LAB — CBC
HCT: 44.8 % (ref 39.0–52.0)
Hemoglobin: 15.6 g/dL (ref 13.0–17.0)
MCH: 31.5 pg (ref 26.0–34.0)
MCHC: 34.8 g/dL (ref 30.0–36.0)
MCV: 90.3 fL (ref 80.0–100.0)
Platelets: 213 10*3/uL (ref 150–400)
RBC: 4.96 MIL/uL (ref 4.22–5.81)
RDW: 12.3 % (ref 11.5–15.5)
WBC: 14.3 10*3/uL — ABNORMAL HIGH (ref 4.0–10.5)
nRBC: 0 % (ref 0.0–0.2)

## 2019-08-26 LAB — SAMPLE TO BLOOD BANK

## 2019-08-26 LAB — LACTIC ACID, PLASMA: Lactic Acid, Venous: 2.3 mmol/L (ref 0.5–1.9)

## 2019-08-26 LAB — CBG MONITORING, ED: Glucose-Capillary: 90 mg/dL (ref 70–99)

## 2019-08-26 MED ORDER — ONDANSETRON HCL 4 MG/2ML IJ SOLN
4.0000 mg | Freq: Once | INTRAMUSCULAR | Status: AC
  Administered 2019-08-26: 4 mg via INTRAVENOUS
  Filled 2019-08-26: qty 2

## 2019-08-26 MED ORDER — IOHEXOL 300 MG/ML  SOLN
100.0000 mL | Freq: Once | INTRAMUSCULAR | Status: AC | PRN
  Administered 2019-08-26: 100 mL via INTRAVENOUS

## 2019-08-26 MED ORDER — SODIUM CHLORIDE 0.9 % IV BOLUS
1000.0000 mL | Freq: Once | INTRAVENOUS | Status: AC
  Administered 2019-08-26: 1000 mL via INTRAVENOUS

## 2019-08-26 MED ORDER — OXYCODONE-ACETAMINOPHEN 5-325 MG PO TABS
1.0000 | ORAL_TABLET | Freq: Once | ORAL | Status: AC
  Administered 2019-08-27: 1 via ORAL
  Filled 2019-08-26: qty 1

## 2019-08-26 MED ORDER — FENTANYL CITRATE (PF) 100 MCG/2ML IJ SOLN
50.0000 ug | Freq: Once | INTRAMUSCULAR | Status: AC
  Administered 2019-08-26: 50 ug via INTRAVENOUS
  Filled 2019-08-26: qty 2

## 2019-08-26 MED ORDER — SODIUM CHLORIDE 0.9 % IV SOLN
INTRAVENOUS | Status: DC
  Administered 2019-08-27: via INTRAVENOUS
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
## 2019-08-26 NOTE — ED Notes (Signed)
Pt called for vitals no response x2

## 2019-08-27 DIAGNOSIS — S129XXA Fracture of neck, unspecified, initial encounter: Secondary | ICD-10-CM | POA: Diagnosis present

## 2019-08-27 LAB — TROPONIN I (HIGH SENSITIVITY): Troponin I (High Sensitivity): 24 ng/L — ABNORMAL HIGH (ref <18)

## 2019-08-27 LAB — SARS CORONAVIRUS 2 (TAT 6-24 HRS): SARS Coronavirus 2: NEGATIVE

## 2019-08-27 MED ORDER — POTASSIUM CHLORIDE IN NACL 20-0.9 MEQ/L-% IV SOLN
INTRAVENOUS | Status: DC
  Administered 2019-08-27: 03:00:00 via INTRAVENOUS
  Filled 2019-08-27: qty 1000

## 2019-08-27 MED ORDER — KETOROLAC TROMETHAMINE 30 MG/ML IJ SOLN
30.0000 mg | Freq: Four times a day (QID) | INTRAMUSCULAR | Status: DC | PRN
  Administered 2019-08-27 (×3): 30 mg via INTRAVENOUS
  Filled 2019-08-27 (×3): qty 1

## 2019-08-27 MED ORDER — ACETAMINOPHEN 325 MG PO TABS: 650.0000 mg | ORAL_TABLET | Freq: Four times a day (QID) | ORAL | Status: DC | PRN

## 2019-08-27 MED ORDER — OXYCODONE HCL ER 10 MG PO T12A
10.0000 mg | EXTENDED_RELEASE_TABLET | Freq: Two times a day (BID) | ORAL | Status: DC
  Administered 2019-08-27: 10 mg via ORAL
  Filled 2019-08-27: qty 1

## 2019-08-27 MED ORDER — DIAZEPAM 5 MG PO TABS: 5.0000 mg | ORAL_TABLET | Freq: Four times a day (QID) | ORAL | Status: DC | PRN

## 2019-08-27 MED ORDER — ONDANSETRON HCL 4 MG/2ML IJ SOLN: 4.0000 mg | Freq: Four times a day (QID) | INTRAMUSCULAR | Status: DC | PRN

## 2019-08-27 MED ORDER — MORPHINE SULFATE (PF) 2 MG/ML IV SOLN: 2.0000 mg | INTRAVENOUS | Status: DC | PRN

## 2019-08-27 MED ORDER — OXYCODONE HCL 5 MG PO TABS
5.0000 mg | ORAL_TABLET | ORAL | Status: DC | PRN
  Administered 2019-08-27: 5 mg via ORAL
  Administered 2019-08-27: 10 mg via ORAL
  Filled 2019-08-27: qty 1
  Filled 2019-08-27: qty 2

## 2019-08-27 MED ORDER — EMTRICITAB-RILPIVIR-TENOFOV AF 200-25-25 MG PO TABS
1.0000 | ORAL_TABLET | Freq: Every day | ORAL | Status: DC
  Administered 2019-08-27: 1 via ORAL
  Filled 2019-08-27 (×3): qty 1

## 2019-08-27 MED ORDER — SODIUM CHLORIDE 0.9 % IV SOLN: 250.0000 mL | INTRAVENOUS | Status: DC | PRN

## 2019-08-27 MED ORDER — SODIUM CHLORIDE 0.9% FLUSH: 3.0000 mL | Freq: Two times a day (BID) | INTRAVENOUS | Status: DC

## 2019-08-27 MED ORDER — ONDANSETRON HCL 4 MG PO TABS: 4.0000 mg | ORAL_TABLET | Freq: Four times a day (QID) | ORAL | Status: DC | PRN

## 2019-08-27 MED ORDER — ACETAMINOPHEN 650 MG RE SUPP: 650.0000 mg | Freq: Four times a day (QID) | RECTAL | Status: DC | PRN

## 2019-08-27 MED ORDER — SODIUM CHLORIDE 0.9% FLUSH: 3.0000 mL | INTRAVENOUS | Status: DC | PRN

## 2019-08-27 NOTE — Discharge Instructions (Signed)
Cervical Spine Fracture, Unstable  A cervical spine fracture is a break in one of the seven bones (vertebrae) that make up the spine of the neck. These bones hold up the head and protect the spinal cord. The spinal cord runs through the center of the vertebrae. An unstable cervical fracture is a fracture that causes or can cause the vertebrae to be out of position (displaced). Unstable cervical fractures may press on the spinal cord and damage it. Unstable cervical fractures create a high risk of spinal cord damage. Damage to the spinal cord can cause loss of feeling and movement (paralysis) from the neck down. What are the causes? This condition may be caused by:  Motor vehicle accidents, including: ? Motorcycle accidents. ? All-terrain vehicle (ATV) accidents.  Diving headfirst into shallow water.  Accidents while playing contact sports, such as football or soccer.  Doing sports with a high risk of falls, like skating or skiing.  Falls, especially among older adults.  Blows to the head.  Weak bones (osteoporosis). What are the symptoms? Symptoms of this condition include:  Severe neck pain and stiffness.  Pain that spreads (radiates) to the shoulders, arms, or legs.  Swelling, bruising, or both on the back of the neck.  Weakness, loss of sensation, or both in the arms, legs, or torso.  Paralysis of the arms or legs.  Loss of bladder control, bowel control, or both.  Labored or difficult breathing. How is this diagnosed? This condition may be diagnosed based on:  Your signs and symptoms, especially if you recently had an injury. Your health care provider will ask about the cause of your injury.  Results of a physical exam. Your health care provider will check for swelling, bruising, or vertebrae that do not line up (step-off deformity).  Results of a neurological exam. This exam will check for weakness, paralysis, reflexes, and loss of sensations to pain, touch, or  temperature below the level of the injury.  You may also have imaging studies of the cervical spine to confirm the diagnosis. These may include: ? X-rays. ? MRI imaging. ? CT scans. How is this treated? Most unstable cervical spine fractures require surgery. But the exact treatment depends on the location of the fracture as well as the position or pattern of the fracture. Treatment also depends on your age and symptoms. Treatment may also include:  Using rigid neck support to keep the fracture from moving (immobilization).  Repositioning the fracture with a special device using metal pins attached to pulleys and weights (traction) to pull the fracture into a normal position for healing.  Surgery to repair and stabilize the fracture through an incision in the front or back of the neck (open reduction). Screws, pins, plates, or bone grafts may also be used to stabilize the fracture.  Wearing a device to support the neck (cervical collar) during healing and recovery. Follow these instructions at home: If you have a neck brace (cervical collar):  Wear the neck brace as told by your health care provider. Do not remove it unless told by your health care provider.  Keep the neck brace clean.  If the neck brace is not waterproof: ? Do not let it get wet. ? Cover it with a watertight covering when you take a bath or a shower. Managing pain, stiffness, and swelling   If directed, put ice on the injured area: ? If you have a removable neck brace, remove it only as told by your health care provider. ?  Put ice in a plastic bag. ? Place a towel between your skin and the bag. ? Leave the ice on for 20 minutes, 2-3 times a day. Activity  Do not lift anything that is heavier than 10 lb (4.5 kg), or the limit that you are told, until your health care provider says that it is safe.  Do not drive or use heavy machinery while taking prescription pain medicine. Ask your health care provider when it  is safe to drive.  Return to your normal activities as told by your health care provider. Ask your health care provider what activities are safe for you. General instructions  Take over-the-counter and prescription medicines only as told by your health care provider.  If you are taking prescription pain medicine, take actions to prevent or treat constipation. Your health care provider may recommend that you: ? Drink enough fluid to keep your urine pale yellow. ? Eat foods that are high in fiber, such as fresh fruits and vegetables, whole grains, and beans. ? Limit foods that are high in fat and processed sugars, such as fried or sweet foods. ? Take an over-the-counter or prescription medicine for constipation.  Do not use any products that contain nicotine or tobacco, such as cigarettes and e-cigarettes. These can delay bone healing. If you need help quitting, ask your health care provider.  Keep all follow-up visits as told by your health care provider. This is important. Contact a health care provider if:  You have pain that is not controlled by your pain medicine.  You have chills or fever.  You have skin irritation from your neck support.  Your neck support become loose or damaged. Get help right away if:  You have difficulty moving or are unable to move your arms or legs.  You have shortness of breath or difficulty breathing.  You lose control of your bladder or bowels.  You have new or severe pain.  You have new areas of numbness, tingling, or muscle weakness. Summary  A cervical spine fracture is a break in one of the seven bones (vertebrae) that make up the spine of your neck.  An unstable cervical spine fracture may damage your spinal cord.  Damage to the spinal cord can cause loss of feeling and movement (paralysis) from the neck down.  Your health care provider can diagnose an unstable cervical fracture with a physical exam, including imaging  studies.  Unstable cervical spine fractures are treated with some combination of immobilization, traction, surgery, and neck support. This information is not intended to replace advice given to you by your health care provider. Make sure you discuss any questions you have with your health care provider. Document Released: 02/10/2018 Document Revised: 02/10/2018 Document Reviewed: 02/10/2018 Elsevier Patient Education  [employer].   How to Use a Hard Cervical Collar A hard cervical collar limits the movement of the top part of your spine (cervical spine). The collar holds your head and neck in a straight position. A cervical collar may be used to treat:  A fracture in the neck.  Damage to the ligaments. Ligaments are tissues that connect bones.  Injury to the spinal cord. There are several types of hard cervical collars. Follow the manufacturer's instructions for use. These are general guidelines. What are the risks? Wearing a cervical collar is safe. However, problems may occur, including:  Skin breakdown.  Sores that form due to rubbing or pressure on the skin (pressure ulcers).  Pain.  Difficulty breathing.  Worsening of your condition if the collar is not placed correctly.  Increased risk of inhaling food or liquid into your lungs (aspiration). Supplies needed:  Extra cervical collar and replacement pads.  Ice.  Plastic bag.  Towel.  A watertight covering to put over the collar during bathing, if needed. How to use a cervical collar  Wear the cervical collar as told by your health care provider. Do not remove it unless told to do so by your health care provider.  You may be directed to remove it only when you check your skin and change the pads. While the collar is off: ? Ask another person to assist you if needed. ? Keep your head and neck straight.  Do not bend your neck or turn your head. ? Check your skin daily for red areas. Ask for help or use a  mirror to check areas you cannot see. ? After checking your skin, wear the extra cervical collar while cleaning and changing the pads of the other collar.  Change the pads daily or more often if they become wet or dirty. Keep a clean set of replacement pads.  Do not let hard plastic edges touch your skin. Cover them with a soft pad.  If your cervical collar is not waterproof: ? Do not let it get wet. ? Cover it with a watertight covering when you take a bath or a shower. Follow these instructions at home:   Put ice on the injured area to manage pain, stiffness, and swelling. ? Do not remove your cervical collar unless told to do so by your health care provider. ? Put ice in a plastic bag. ? Place a towel between your skin and the bag or between your cervical collar and the bag. ? Leave the ice on for 20 minutes, 2-3 times a day for the first 2 days after your injury.  Do not drive any vehicle until your health care provider approves.  Keep all follow-up visits as told by your health care provider. This is important. Any delay in getting the care that you need can prevent proper healing of your injury. Contact a health care provider if you have:  Red areas of skin under your cervical collar.  Pain that is not controlled with your medicines. Get help right away if you have:  Numbness or weakness in your arms or legs.  Difficulty breathing. These symptoms may represent a serious problem that is an emergency. Do not wait to see if the symptoms will go away. Get medical help right away. Call your local emergency services (911 in the [place] Do not drive yourself to the hospital. Summary  A cervical collar is a device that supports the chin and the back of the head. It restricts movement of the neck to prevent more damage after a severe injury.  A cervical collar may be used to treat a fracture in the neck, damage to tissues that hold bones together (ligaments), or injury to the spinal  cord.  Wear the cervical collar as told by your health care provider. Ask if you may remove the collar to shower, bathe, or eat, or to put ice on your neck. This information is not intended to replace advice given to you by your health care provider. Make sure you discuss any questions you have with your health care provider. Document Released: 07/21/2004 Document Revised: 05/06/2018 Document Reviewed: 09/20/2017 Elsevier Patient Education  2020 [employer]

## 2019-08-27 NOTE — Progress Notes (Signed)
Patient arrived to unit [id number]
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
## 2019-08-27 NOTE — Progress Notes (Signed)
Patient and mother given discharge instructions and teaching. Able to verbalize understanding. No new questions or concerns. IV removed.
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
## 2019-10-20 ENCOUNTER — Other Ambulatory Visit: Payer: PRIVATE HEALTH INSURANCE

## 2019-10-20 ENCOUNTER — Other Ambulatory Visit: Payer: Self-pay

## 2019-10-20 ENCOUNTER — Other Ambulatory Visit: Payer: Self-pay | Admitting: *Deleted

## 2019-10-20 DIAGNOSIS — Z113 Encounter for screening for infections with a predominantly sexual mode of transmission: Secondary | ICD-10-CM

## 2019-10-20 DIAGNOSIS — Z79899 Other long term (current) drug therapy: Secondary | ICD-10-CM

## 2019-10-20 DIAGNOSIS — B20 Human immunodeficiency virus [HIV] disease: Secondary | ICD-10-CM

## 2019-10-21 ENCOUNTER — Other Ambulatory Visit: Payer: Self-pay | Admitting: Neurosurgery

## 2019-10-21 DIAGNOSIS — S12090D Other displaced fracture of first cervical vertebra, subsequent encounter for fracture with routine healing: Secondary | ICD-10-CM

## 2019-10-21 LAB — URINE CYTOLOGY ANCILLARY ONLY
Chlamydia: NEGATIVE
Comment: NEGATIVE
Comment: NORMAL
Neisseria Gonorrhea: NEGATIVE

## 2019-10-21 LAB — T-HELPER CELL (CD4) - (RCID CLINIC ONLY)
CD4 % Helper T Cell: 30 % — ABNORMAL LOW (ref 33–65)
CD4 T Cell Abs: 770 /uL (ref 400–1790)

## 2019-10-29 ENCOUNTER — Other Ambulatory Visit: Payer: Self-pay

## 2019-10-29 ENCOUNTER — Ambulatory Visit
Admission: RE | Admit: 2019-10-29 | Discharge: 2019-10-29 | Disposition: A | Discharge status: Home / Self Care | Payer: PRIVATE HEALTH INSURANCE | Source: Ambulatory Visit | Attending: Neurosurgery | Admitting: Neurosurgery

## 2019-10-29 DIAGNOSIS — B360 Pityriasis versicolor: Secondary | ICD-10-CM

## 2019-10-29 DIAGNOSIS — S12090D Other displaced fracture of first cervical vertebra, subsequent encounter for fracture with routine healing: Secondary | ICD-10-CM

## 2019-10-29 MED ORDER — ODEFSEY 200-25-25 MG PO TABS: 1.0000 | ORAL_TABLET | Freq: Every day | ORAL | 0 refills | Status: DC

## 2019-10-31 LAB — COMPLETE METABOLIC PANEL WITH GFR
AG Ratio: 1.4 (calc) (ref 1.0–2.5)
ALT: 31 U/L (ref 9–46)
AST: 24 U/L (ref 10–40)
Albumin: 4.2 g/dL (ref 3.6–5.1)
Alkaline phosphatase (APISO): 56 U/L (ref 36–130)
BUN: 10 mg/dL (ref 7–25)
CO2: 29 mmol/L (ref 20–32)
Calcium: 9.5 mg/dL (ref 8.6–10.3)
Chloride: 103 mmol/L (ref 98–110)
Creat: 1.05 mg/dL (ref 0.60–1.35)
GFR, Est African American: 108 mL/min/{1.73_m2} (ref >=60)
GFR, Est Non African American: 93 mL/min/{1.73_m2} (ref >=60)
Globulin: 2.9 g/dL (calc) (ref 1.9–3.7)
Glucose, Bld: 98 mg/dL (ref 65–99)
Potassium: 3.9 mmol/L (ref 3.5–5.3)
Sodium: 139 mmol/L (ref 135–146)
Total Bilirubin: 0.5 mg/dL (ref 0.2–1.2)
Total Protein: 7.1 g/dL (ref 6.1–8.1)

## 2019-10-31 LAB — CBC WITH DIFFERENTIAL/PLATELET
Absolute Monocytes: 502 cells/uL (ref 200–950)
Basophils Absolute: 13 cells/uL (ref 0–200)
Basophils Relative: 0.2 %
Eosinophils Absolute: 158 cells/uL (ref 15–500)
Eosinophils Relative: 2.4 %
HCT: 42.7 % (ref 38.5–50.0)
Hemoglobin: 14.2 g/dL (ref 13.2–17.1)
Lymphs Abs: 2759 cells/uL (ref 850–3900)
MCH: 30 pg (ref 27.0–33.0)
MCHC: 33.3 g/dL (ref 32.0–36.0)
MCV: 90.3 fL (ref 80.0–100.0)
MPV: 10.6 fL (ref 7.5–12.5)
Monocytes Relative: 7.6 %
Neutro Abs: 3168 cells/uL (ref 1500–7800)
Neutrophils Relative %: 48 %
Platelets: 203 10*3/uL (ref 140–400)
RBC: 4.73 10*6/uL (ref 4.20–5.80)
RDW: 12.4 % (ref 11.0–15.0)
Total Lymphocyte: 41.8 %
WBC: 6.6 10*3/uL (ref 3.8–10.8)

## 2019-10-31 LAB — HIV-1 RNA QUANT-NO REFLEX-BLD
HIV 1 RNA Quant: <20 copies/mL — AB
HIV-1 RNA Quant, Log: <1.3 Log copies/mL — AB

## 2019-10-31 LAB — LIPID PANEL
Cholesterol: 130 mg/dL (ref <200)
HDL: 40 mg/dL (ref >=40)
LDL Cholesterol (Calc): 73 mg/dL (calc)
Non-HDL Cholesterol (Calc): 90 mg/dL (calc) (ref <130)
Total CHOL/HDL Ratio: 3.3 (calc) (ref <5.0)
Triglycerides: 83 mg/dL (ref <150)

## 2019-10-31 LAB — RPR: RPR Ser Ql: REACTIVE — AB

## 2019-10-31 LAB — FLUORESCENT TREPONEMAL AB(FTA)-IGG-BLD: Fluorescent Treponemal ABS: REACTIVE — AB

## 2019-10-31 LAB — RPR TITER: RPR Titer: 1:16 {titer} — ABNORMAL HIGH

## 2019-11-02 ENCOUNTER — Ambulatory Visit (INDEPENDENT_AMBULATORY_CARE_PROVIDER_SITE_OTHER): Payer: PRIVATE HEALTH INSURANCE | Admitting: Internal Medicine

## 2019-11-02 ENCOUNTER — Other Ambulatory Visit: Payer: Self-pay

## 2019-11-02 ENCOUNTER — Encounter: Payer: Self-pay | Admitting: Internal Medicine

## 2019-11-02 DIAGNOSIS — A539 Syphilis, unspecified: Secondary | ICD-10-CM | POA: Diagnosis not present

## 2019-11-02 DIAGNOSIS — S12501D Unspecified nondisplaced fracture of sixth cervical vertebra, subsequent encounter for fracture with routine healing: Secondary | ICD-10-CM

## 2019-11-02 DIAGNOSIS — B20 Human immunodeficiency virus [HIV] disease: Secondary | ICD-10-CM

## 2019-11-02 NOTE — Assessment & Plan Note (Signed)
His infection is under excellent, long-term control.  He received his influenza vaccine today.  He will continue Odefsey and follow-up after lab work in 1 year.
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
## 2019-11-02 NOTE — Assessment & Plan Note (Signed)
His RPR has been reactive and stable for many years after treatment for syphilis.  He is serofast.

## 2019-11-23 ENCOUNTER — Other Ambulatory Visit: Payer: Self-pay | Admitting: *Deleted

## 2019-11-23 DIAGNOSIS — B360 Pityriasis versicolor: Secondary | ICD-10-CM

## 2019-11-23 MED ORDER — ODEFSEY 200-25-25 MG PO TABS: 1.0000 | ORAL_TABLET | Freq: Every day | ORAL | 9 refills | Status: DC

## 2019-11-24 ENCOUNTER — Telehealth: Payer: Self-pay | Admitting: Pharmacy Technician

## 2019-11-24 MED FILL — ODEFSEY 200-25-25 MG TABS: 200-25-25 | 30 days supply | Qty: 30 | Fill #0

## 2019-11-24 NOTE — Telephone Encounter (Signed)
[facility] Patient Advocate Encounter   Patient has been approved for [employer] Advancing Access Patient Assistance Program for [facility] for 30-days. This assistance will make the patient's copay $0.  I have spoken with the patient and they will have their medication mailed from [facility] on 01/12.  The billing information is:  Member ID: [id number] RxBin: [id number] PCN: [id number] Group: [id number]  Patient knows to call the office with questions or concerns.  Beulah Gandy, CPhT Specialty Pharmacy Patient [facility] Phone: [phone] Fax: [phone] 11/24/2019 5:23 PM

## 2019-11-30 NOTE — Telephone Encounter (Signed)
[facility] Patient Advocate Encounter  Patient is approved 11/24/2019 through 11/23/2020.   Craig Shah. Dimas Aguas CPhT Specialty Pharmacy Patient [facility] Phone: [phone] Fax:  [phone]

## 2020-01-01 MED FILL — ODEFSEY 200-25-25 MG TABS: 200-25-25 | 30 days supply | Qty: 30 | Fill #1

## 2020-01-29 MED FILL — ODEFSEY 200-25-25 MG TABS: 200-25-25 | 30 days supply | Qty: 30 | Fill #2

## 2020-02-29 MED FILL — ODEFSEY 200-25-25 MG TABS: 200-25-25 | 30 days supply | Qty: 30 | Fill #3

## 2020-03-31 MED FILL — ODEFSEY 200-25-25 MG TABS: 200-25-25 | 30 days supply | Qty: 30 | Fill #4

## 2020-05-05 MED FILL — ODEFSEY 200-25-25 MG TABS: 200-25-25 | 30 days supply | Qty: 30 | Fill #5

## 2020-06-08 MED FILL — ODEFSEY 200-25-25 MG TABS: 200-25-25 | 30 days supply | Qty: 30 | Fill #6

## 2020-07-12 MED FILL — ODEFSEY 200-25-25 MG TABS: 200-25-25 | 30 days supply | Qty: 30 | Fill #7

## 2020-08-11 MED FILL — ODEFSEY 200-25-25 MG TABS: 200-25-25 | 30 days supply | Qty: 30 | Fill #8

## 2020-09-07 MED FILL — ODEFSEY 200-25-25 MG TABS: 200-25-25 | 30 days supply | Qty: 30 | Fill #9

## 2020-10-12 MED FILL — ODEFSEY 200-25-25 MG TABS: 200-25-25 | 30 days supply | Qty: 30 | Fill #0
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
## 2020-11-01 ENCOUNTER — Other Ambulatory Visit: Payer: Self-pay | Admitting: Internal Medicine

## 2020-11-01 DIAGNOSIS — B360 Pityriasis versicolor: Secondary | ICD-10-CM

## 2020-11-07 ENCOUNTER — Other Ambulatory Visit: Payer: Self-pay

## 2020-11-07 ENCOUNTER — Other Ambulatory Visit: Payer: PRIVATE HEALTH INSURANCE

## 2020-11-07 ENCOUNTER — Other Ambulatory Visit (HOSPITAL_COMMUNITY)
Admission: RE | Admit: 2020-11-07 | Discharge: 2020-11-07 | Disposition: A | Discharge status: Home / Self Care | Payer: PRIVATE HEALTH INSURANCE | Source: Ambulatory Visit | Attending: Internal Medicine | Admitting: Internal Medicine

## 2020-11-07 DIAGNOSIS — Z113 Encounter for screening for infections with a predominantly sexual mode of transmission: Secondary | ICD-10-CM

## 2020-11-07 DIAGNOSIS — B20 Human immunodeficiency virus [HIV] disease: Secondary | ICD-10-CM

## 2020-11-07 DIAGNOSIS — Z79899 Other long term (current) drug therapy: Secondary | ICD-10-CM

## 2020-11-08 LAB — T-HELPER CELL (CD4) - (RCID CLINIC ONLY)
CD4 % Helper T Cell: 39 % (ref 33–65)
CD4 T Cell Abs: 792 /uL (ref 400–1790)

## 2020-11-08 LAB — URINE CYTOLOGY ANCILLARY ONLY
Chlamydia: NEGATIVE
Comment: NEGATIVE
Comment: NORMAL
Neisseria Gonorrhea: NEGATIVE

## 2020-11-18 LAB — LIPID PANEL
Cholesterol: 145 mg/dL (ref <200)
HDL: 41 mg/dL (ref >=40)
LDL Cholesterol (Calc): 85 mg/dL (calc)
Non-HDL Cholesterol (Calc): 104 mg/dL (calc) (ref <130)
Total CHOL/HDL Ratio: 3.5 (calc) (ref <5.0)
Triglycerides: 92 mg/dL (ref <150)

## 2020-11-18 LAB — HIV-1 RNA QUANT-NO REFLEX-BLD
HIV 1 RNA Quant: <20 Copies/mL — ABNORMAL HIGH
HIV-1 RNA Quant, Log: <1.3 Log cps/mL — ABNORMAL HIGH

## 2020-11-18 LAB — CBC WITH DIFFERENTIAL/PLATELET
Absolute Monocytes: 459 cells/uL (ref 200–950)
Basophils Absolute: 11 cells/uL (ref 0–200)
Basophils Relative: 0.2 %
Eosinophils Absolute: 49 cells/uL (ref 15–500)
Eosinophils Relative: 0.9 %
HCT: 44.3 % (ref 38.5–50.0)
Hemoglobin: 14.8 g/dL (ref 13.2–17.1)
Lymphs Abs: 2079 cells/uL (ref 850–3900)
MCH: 30.1 pg (ref 27.0–33.0)
MCHC: 33.4 g/dL (ref 32.0–36.0)
MCV: 90.2 fL (ref 80.0–100.0)
MPV: 10.7 fL (ref 7.5–12.5)
Monocytes Relative: 8.5 %
Neutro Abs: 2803 cells/uL (ref 1500–7800)
Neutrophils Relative %: 51.9 %
Platelets: 228 10*3/uL (ref 140–400)
RBC: 4.91 10*6/uL (ref 4.20–5.80)
RDW: 12 % (ref 11.0–15.0)
Total Lymphocyte: 38.5 %
WBC: 5.4 10*3/uL (ref 3.8–10.8)

## 2020-11-18 LAB — COMPLETE METABOLIC PANEL WITH GFR
AG Ratio: 1.4 (calc) (ref 1.0–2.5)
ALT: 28 U/L (ref 9–46)
AST: 24 U/L (ref 10–40)
Albumin: 4.2 g/dL (ref 3.6–5.1)
Alkaline phosphatase (APISO): 57 U/L (ref 36–130)
BUN: 15 mg/dL (ref 7–25)
CO2: 31 mmol/L (ref 20–32)
Calcium: 9.3 mg/dL (ref 8.6–10.3)
Chloride: 103 mmol/L (ref 98–110)
Creat: 1.08 mg/dL (ref 0.60–1.35)
GFR, Est African American: 104 mL/min/{1.73_m2} (ref >=60)
GFR, Est Non African American: 90 mL/min/{1.73_m2} (ref >=60)
Globulin: 2.9 g/dL (calc) (ref 1.9–3.7)
Glucose, Bld: 106 mg/dL — ABNORMAL HIGH (ref 65–99)
Potassium: 4 mmol/L (ref 3.5–5.3)
Sodium: 139 mmol/L (ref 135–146)
Total Bilirubin: 0.5 mg/dL (ref 0.2–1.2)
Total Protein: 7.1 g/dL (ref 6.1–8.1)

## 2020-11-18 LAB — RPR: RPR Ser Ql: REACTIVE — AB

## 2020-11-18 LAB — FLUORESCENT TREPONEMAL AB(FTA)-IGG-BLD: Fluorescent Treponemal ABS: REACTIVE — AB

## 2020-11-18 LAB — RPR TITER: RPR Titer: 1:16 {titer} — ABNORMAL HIGH

## 2020-11-24 ENCOUNTER — Other Ambulatory Visit: Payer: Self-pay | Admitting: Internal Medicine

## 2020-11-24 ENCOUNTER — Other Ambulatory Visit: Payer: Self-pay

## 2020-11-24 ENCOUNTER — Ambulatory Visit (INDEPENDENT_AMBULATORY_CARE_PROVIDER_SITE_OTHER): Payer: PRIVATE HEALTH INSURANCE | Admitting: Internal Medicine

## 2020-11-24 ENCOUNTER — Encounter: Payer: Self-pay | Admitting: Internal Medicine

## 2020-11-24 VITALS — Temp 98.6°F | Ht 67.0 in | Wt 157.0 lb

## 2020-11-24 DIAGNOSIS — A539 Syphilis, unspecified: Secondary | ICD-10-CM

## 2020-11-24 DIAGNOSIS — B20 Human immunodeficiency virus [HIV] disease: Secondary | ICD-10-CM

## 2020-11-24 DIAGNOSIS — Z23 Encounter for immunization: Secondary | ICD-10-CM

## 2020-11-24 DIAGNOSIS — B360 Pityriasis versicolor: Secondary | ICD-10-CM | POA: Diagnosis not present

## 2020-11-24 MED ORDER — ODEFSEY 200-25-25 MG PO TABS: 1.0000 | ORAL_TABLET | Freq: Every day | ORAL | 11 refills | Status: DC

## 2020-11-24 NOTE — Assessment & Plan Note (Signed)
His RPR is stable following treatment for syphilis.  He is serofast and does not need retreatment.  I reminded him to be very careful about limiting the number of future partners he has very careful about partner selection and testing and consistent condom use.

## 2020-11-24 NOTE — Assessment & Plan Note (Signed)
His infection remains under excellent, long-term control.  He will continue Odefsey and follow-up after lab work in 1 year.  He received his influenza vaccine here today and is scheduled to get his pneumonia booster soon.  I recommended getting his COVID booster as soon as possible.

## 2020-11-24 NOTE — Progress Notes (Signed)
Patient Active Problem List   Diagnosis Date Noted  . HIV disease (HCC) 10/02/2016    Priority: High  . Closed fracture of cervical spine (HCC) 08/27/2019  . Chlamydia 08/19/2018  . Pain, dental 07/30/2017  . Former smoker 10/02/2016  . Tinea versicolor 10/02/2016  . 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 Syphilis 10/02/2016  . 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    Take 1 tablet by mouth daily.  Discontinued Medications   No medications on file    Subjective: Craig Shah is in for his routine HIV follow-up visit.  He denies any problems obtaining, taking or tolerating his Odefsey and he does not recall missing any other doses.  He is not on any other medication currently.  He is not in a relationship and has not been sexually active but is considering dating someone.  He has had his first 2 COVID vaccines and is due for his booster.  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  He is feeling well.  He denies any depression or anxiety.  Review of Systems: Review of Systems  Constitutional: Negative for weight loss.  Psychiatric/Behavioral: Negative for depression. The patient is not nervous/anxious.     Past Medical History:  Diagnosis Date  . HIV infection (HCC)     Social History   Tobacco Use  . Smoking status: Former Smoker    Years: 8.00    Quit date: 05/12/2016    Years since quitting: 4.5  . Smokeless tobacco: Never Used  Substance Use Topics  . Alcohol use: Yes    Comment: occassional  . Drug use: Yes    Frequency: 7.0 times per week    Types:  Marijuana    Comment: recreational    Family History  Problem Relation Age of Onset  . Cancer Paternal Grandmother   . Colon cancer Paternal Grandmother   . Alcohol abuse Father   . Heart disease Father     No Known Allergies  Health Maintenance  Topic Date Due  . COVID-19 Vaccine (3 - Pfizer risk 4-dose series) 04/13/2020  . INFLUENZA VACCINE  06/12/2020  . TETANUS/TDAP  11/18/2024  . Hepatitis C Screening  Completed  . HIV Screening  Completed    Objective:  Vitals:   11/24/20 0851  Temp: 98.6 F (37 C)  TempSrc: Oral  Weight: 157 lb (71.2 kg)  Height: 5\' 7"  (1.702 m)   Body mass index is 24.59 kg/m.  Physical Exam Constitutional:      Comments: He is in very good spirits.  Cardiovascular:     Rate and Rhythm: Normal rate.  Pulmonary:     Effort: Pulmonary effort is normal.  Psychiatric:        Mood and Affect: Mood normal.     Lab Results Lab Results  Component Value Date   WBC 5.4 11/07/2020   HGB 14.8 11/07/2020   HCT 44.3 11/07/2020   MCV 90.2 11/07/2020   PLT 228 11/07/2020    Lab Results  Component Value Date   CREATININE 1.08 11/07/2020   BUN  15 11/07/2020   NA 139 11/07/2020   K 4.0 11/07/2020   CL 103 11/07/2020   CO2 31 11/07/2020    Lab Results  Component Value Date   ALT 28 11/07/2020   AST 24 11/07/2020   ALKPHOS 60 08/26/2019   BILITOT 0.5 11/07/2020    Lab Results  Component Value Date   CHOL 145 11/07/2020   HDL 41 11/07/2020   LDLCALC 85 11/07/2020   TRIG 92 11/07/2020   CHOLHDL 3.5 11/07/2020   Lab Results  Component Value Date   LABRPR REACTIVE (A) 11/07/2020   RPRTITER 1:16 (H) 11/07/2020   HIV 1 RNA Quant  Date Value  11/07/2020 <20 Copies/mL (H)  10/20/2019 <20 DETECTED copies/mL (A)  07/30/2018 <20 NOT DETECTED copies/mL   CD4 T Cell Abs (/uL)  Date Value  11/07/2020 792  10/20/2019 770  07/30/2018 780     Problem List Items Addressed This Visit      High   HIV disease (HCC)    His  infection remains under excellent, long-term control.  He will continue Odefsey and follow-up after lab work in 1 year.  He received his influenza vaccine here today and is scheduled to get his pneumonia booster soon.  I recommended getting his COVID booster as soon as possible.      Relevant Medications   emtricitabine-rilpivir-tenofovir AF (ODEFSEY) 200-25-25 MG TABS tablet   Other Relevant Orders   CBC   T-helper cell (CD4)- ([facility] clinic only)   Comprehensive metabolic panel   Lipid panel   RPR   HIV-1 RNA quant-no reflex-bld     Unprioritized   Tinea versicolor   Relevant Medications   emtricitabine-rilpivir-tenofovir AF (ODEFSEY) 200-25-25 MG TABS tablet   Syphilis    His RPR is stable following treatment for syphilis.  He is serofast and does not need retreatment.  I reminded him to be very careful about limiting the number of future partners he has very careful about partner selection and testing and consistent condom use.      Relevant Medications   emtricitabine-rilpivir-tenofovir AF (ODEFSEY) 200-25-25 MG TABS tablet        Cliffton Asters, MD [facility] [phone] pager   [phone] cell 11/24/2020, 9:16 AM

## 2021-01-25 ENCOUNTER — Telehealth: Payer: Self-pay

## 2021-01-25 NOTE — Telephone Encounter (Signed)
[facility] Patient Advocate Encounter  Completed and sent [employer] Advancing Access application for [facility] for this patient who is uninsured.    Patient is approved 01/25/21 through 11/11/21.  BIN      [id number] PCN    [id number] GRP    [id number] ID        [id number]   Clearance Coots, CPhT [facility] Phone: [phone] Fax:  [phone]

## 2021-02-09 ENCOUNTER — Other Ambulatory Visit (HOSPITAL_COMMUNITY): Payer: Self-pay

## 2021-02-13 ENCOUNTER — Other Ambulatory Visit (HOSPITAL_COMMUNITY): Payer: Self-pay

## 2021-02-13 MED FILL — Emtricitabine-Rilpivirine-Tenofovir AF Tab 200-25-25 MG: ORAL | 30 days supply | Qty: 30 | Fill #0 | Status: AC

## 2021-02-14 ENCOUNTER — Other Ambulatory Visit (HOSPITAL_COMMUNITY): Payer: Self-pay

## 2021-02-20 ENCOUNTER — Other Ambulatory Visit (HOSPITAL_COMMUNITY): Payer: Self-pay

## 2021-02-22 ENCOUNTER — Other Ambulatory Visit (HOSPITAL_COMMUNITY): Payer: Self-pay

## 2021-03-22 ENCOUNTER — Other Ambulatory Visit (HOSPITAL_COMMUNITY): Payer: Self-pay

## 2021-03-22 MED FILL — Emtricitabine-Rilpivirine-Tenofovir AF Tab 200-25-25 MG: ORAL | 30 days supply | Qty: 30 | Fill #1 | Status: AC

## 2021-03-29 ENCOUNTER — Other Ambulatory Visit (HOSPITAL_COMMUNITY): Payer: Self-pay

## 2021-04-24 ENCOUNTER — Other Ambulatory Visit (HOSPITAL_COMMUNITY): Payer: Self-pay

## 2021-04-26 ENCOUNTER — Other Ambulatory Visit (HOSPITAL_COMMUNITY): Payer: Self-pay

## 2021-04-26 MED FILL — Emtricitabine-Rilpivirine-Tenofovir AF Tab 200-25-25 MG: ORAL | 30 days supply | Qty: 30 | Fill #2 | Status: AC

## 2021-05-04 ENCOUNTER — Other Ambulatory Visit (HOSPITAL_COMMUNITY): Payer: Self-pay

## 2021-05-31 ENCOUNTER — Other Ambulatory Visit (HOSPITAL_COMMUNITY): Payer: Self-pay

## 2021-06-07 ENCOUNTER — Other Ambulatory Visit (HOSPITAL_COMMUNITY): Payer: Self-pay

## 2021-06-07 MED FILL — Emtricitabine-Rilpivirine-Tenofovir AF Tab 200-25-25 MG: ORAL | 30 days supply | Qty: 30 | Fill #3 | Status: AC

## 2021-07-06 ENCOUNTER — Other Ambulatory Visit (HOSPITAL_COMMUNITY): Payer: Self-pay

## 2021-07-06 MED FILL — Emtricitabine-Rilpivirine-Tenofovir AF Tab 200-25-25 MG: ORAL | 30 days supply | Qty: 30 | Fill #4 | Status: AC

## 2021-07-13 ENCOUNTER — Other Ambulatory Visit (HOSPITAL_COMMUNITY): Payer: Self-pay

## 2021-08-10 ENCOUNTER — Other Ambulatory Visit (HOSPITAL_COMMUNITY): Payer: Self-pay

## 2021-08-10 MED FILL — Emtricitabine-Rilpivirine-Tenofovir AF Tab 200-25-25 MG: ORAL | 30 days supply | Qty: 30 | Fill #5 | Status: AC

## 2021-08-17 ENCOUNTER — Other Ambulatory Visit (HOSPITAL_COMMUNITY): Payer: Self-pay

## 2021-09-12 ENCOUNTER — Other Ambulatory Visit (HOSPITAL_COMMUNITY): Payer: Self-pay

## 2021-09-12 MED FILL — Emtricitabine-Rilpivirine-Tenofovir AF Tab 200-25-25 MG: ORAL | 30 days supply | Qty: 30 | Fill #6 | Status: AC

## 2021-09-22 ENCOUNTER — Other Ambulatory Visit (HOSPITAL_COMMUNITY): Payer: Self-pay

## 2021-10-17 ENCOUNTER — Other Ambulatory Visit (HOSPITAL_COMMUNITY): Payer: Self-pay

## 2021-10-17 MED FILL — Emtricitabine-Rilpivirine-Tenofovir AF Tab 200-25-25 MG: ORAL | 30 days supply | Qty: 30 | Fill #7 | Status: AC

## 2021-10-24 ENCOUNTER — Other Ambulatory Visit (HOSPITAL_COMMUNITY): Payer: Self-pay

## 2021-11-21 ENCOUNTER — Other Ambulatory Visit (HOSPITAL_COMMUNITY): Payer: Self-pay

## 2021-11-23 ENCOUNTER — Other Ambulatory Visit (HOSPITAL_COMMUNITY): Payer: Self-pay

## 2021-11-23 MED FILL — Emtricitabine-Rilpivirine-Tenofovir AF Tab 200-25-25 MG: ORAL | 30 days supply | Qty: 30 | Fill #8 | Status: CN

## 2021-11-30 ENCOUNTER — Other Ambulatory Visit: Payer: Self-pay | Admitting: Internal Medicine

## 2021-11-30 ENCOUNTER — Other Ambulatory Visit (HOSPITAL_COMMUNITY): Payer: Self-pay

## 2021-11-30 DIAGNOSIS — B360 Pityriasis versicolor: Secondary | ICD-10-CM

## 2021-11-30 MED ORDER — ODEFSEY 200-25-25 MG PO TABS
1.0000 | ORAL_TABLET | Freq: Every day | ORAL | 0 refills | Status: DC
  Filled 2021-11-30: qty 30, 30d supply, fill #0

## 2021-12-20 ENCOUNTER — Other Ambulatory Visit (HOSPITAL_COMMUNITY): Payer: Self-pay

## 2021-12-25 ENCOUNTER — Other Ambulatory Visit (HOSPITAL_COMMUNITY): Payer: Self-pay

## 2021-12-25 ENCOUNTER — Other Ambulatory Visit: Payer: Self-pay | Admitting: Internal Medicine

## 2021-12-25 DIAGNOSIS — B360 Pityriasis versicolor: Secondary | ICD-10-CM

## 2021-12-25 MED ORDER — ODEFSEY 200-25-25 MG PO TABS
1.0000 | ORAL_TABLET | Freq: Every day | ORAL | 0 refills | Status: DC
  Filled 2021-12-25: qty 30, 30d supply, fill #0

## 2021-12-26 ENCOUNTER — Other Ambulatory Visit (HOSPITAL_COMMUNITY): Payer: Self-pay

## 2022-01-10 ENCOUNTER — Ambulatory Visit (INDEPENDENT_AMBULATORY_CARE_PROVIDER_SITE_OTHER): Payer: Self-pay

## 2022-01-10 ENCOUNTER — Encounter: Payer: Self-pay | Admitting: Internal Medicine

## 2022-01-10 ENCOUNTER — Ambulatory Visit (INDEPENDENT_AMBULATORY_CARE_PROVIDER_SITE_OTHER): Payer: Self-pay | Admitting: Internal Medicine

## 2022-01-10 ENCOUNTER — Other Ambulatory Visit: Payer: Self-pay

## 2022-01-10 ENCOUNTER — Other Ambulatory Visit (HOSPITAL_COMMUNITY): Payer: Self-pay

## 2022-01-10 DIAGNOSIS — B20 Human immunodeficiency virus [HIV] disease: Secondary | ICD-10-CM

## 2022-01-10 DIAGNOSIS — A539 Syphilis, unspecified: Secondary | ICD-10-CM

## 2022-01-10 DIAGNOSIS — B360 Pityriasis versicolor: Secondary | ICD-10-CM

## 2022-01-10 DIAGNOSIS — K6282 Dysplasia of anus: Secondary | ICD-10-CM

## 2022-01-10 DIAGNOSIS — Z23 Encounter for immunization: Secondary | ICD-10-CM

## 2022-01-10 MED ORDER — ODEFSEY 200-25-25 MG PO TABS
1.0000 | ORAL_TABLET | Freq: Every day | ORAL | 11 refills | Status: DC
  Filled 2022-01-10 – 2022-02-23 (×3): qty 30, 30d supply, fill #0

## 2022-01-10 NOTE — Progress Notes (Signed)
? ?   ? ? ? ? ?Patient Active Problem List  ? Diagnosis Date Noted  ? HIV disease (HCC) 10/02/2016  ?  Priority: High  ? Closed fracture of cervical spine (HCC) 08/27/2019  ? Chlamydia 08/19/2018  ? Pain, dental 07/30/2017  ? Former smoker 10/02/2016  ? Tinea versicolor 10/02/2016  ? 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 Syphilis 10/02/2016  ? 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 ? ?Patient's Medications  ?New Prescriptions  ? No medications on file  ?Previous Medications  ? No medications on file  ?Modified Medications  ? Modified Medication Previous Medication  ? EMTRICITABINE-RILPIVIR-TENOFOVIR AF (ODEFSEY) 200-25-25 MG TABS TABLET emtricitabine-rilpivir-tenofovir AF (ODEFSEY) 200-25-25 MG TABS tablet  ?    Take 1 tablet by mouth daily.    TAKE 1 TABLET BY MOUTH DAILY.  ?Discontinued Medications  ? No medications on file  ? ? ?Subjective: ?Craig Shah is in for his routine HIV follow-up visit.  He denies any problems obtaining, taking or tolerating Odefsey and does not recall missing a single dose.  He is feeling well.  He moved to [place] last October to be closer to his brother who works at [employer].  He is working at [employer] there.  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  He has recently started dancing for exercise and fine.  He denies feeling anxious or depressed.  He has had his annual influenza vaccine.  He says that he remains happily single.  He has not been sexually active. ? ?Review of Systems: ?Review of Systems  ?Constitutional:  Negative for fever and weight loss.  ?Skin:  Negative for rash.  ?Psychiatric/Behavioral:  Negative for depression. The patient is not nervous/anxious.   ? ?Past Medical History:  ?Diagnosis Date  ? HIV infection (HCC)   ? ? ?Social History  ? ?Tobacco Use  ? Smoking status: Former  ?  Years: 8.00  ?  Types: Cigarettes  ?  Quit date: 05/12/2016  ?  Years since quitting: 5.6  ? Smokeless tobacco: Never  ?Substance Use Topics  ? Alcohol use: Yes   ?  Comment: occassional  ? Drug use: Yes  ?  Frequency: 7.0 times per week  ?  Types: Marijuana  ?  Comment: recreational  ? ? ?Family History  ?Problem Relation Age of Onset  ? Cancer Paternal Grandmother   ? Colon cancer Paternal Grandmother   ? Alcohol abuse Father   ? Heart disease Father   ? ? ?No Known Allergies ? ?Health Maintenance  ?Topic Date Due  ? COVID-19 Vaccine (3 - Pfizer risk series) 04/13/2020  ? INFLUENZA VACCINE  06/12/2021  ? TETANUS/TDAP  11/18/2024  ? Hepatitis C Screening  Completed  ? HIV Screening  Completed  ? HPV VACCINES  Aged Out  ? ? ?Objective: ? ?Vitals:  ? 01/10/22 0832  ?BP: 130/84  ?Pulse: 64  ?Temp: 97.8 ?F (36.6 ?C)  ?TempSrc: Temporal  ?Weight: 168 lb (76.2 kg)  ? ?Body mass index is 26.31 kg/m?. ? ?Physical Exam ?Constitutional:   ?   Comments: He is smiling and in good spirits.  ?Cardiovascular:  ?   Rate and Rhythm: Normal rate and regular rhythm.  ?   Heart sounds: No murmur heard. ?Pulmonary:  ?   Effort: Pulmonary effort is normal.  ?   Breath sounds: Normal breath sounds.  ?Psychiatric:     ?   Mood and Affect: Mood normal.  ? ? ?Lab Results ?Lab  Results  ?Component Value Date  ? WBC 5.4 11/07/2020  ? HGB 14.8 11/07/2020  ? HCT 44.3 11/07/2020  ? MCV 90.2 11/07/2020  ? PLT 228 11/07/2020  ?  ?Lab Results  ?Component Value Date  ? CREATININE 1.08 11/07/2020  ? BUN 15 11/07/2020  ? NA 139 11/07/2020  ? K 4.0 11/07/2020  ? CL 103 11/07/2020  ? CO2 31 11/07/2020  ?  ?Lab Results  ?Component Value Date  ? ALT 28 11/07/2020  ? AST 24 11/07/2020  ? ALKPHOS 60 08/26/2019  ? BILITOT 0.5 11/07/2020  ?  ?Lab Results  ?Component Value Date  ? CHOL 145 11/07/2020  ? HDL 41 11/07/2020  ? LDLCALC 85 11/07/2020  ? TRIG 92 11/07/2020  ? CHOLHDL 3.5 11/07/2020  ? ?Lab Results  ?Component Value Date  ? LABRPR REACTIVE (A) 11/07/2020  ? RPRTITER 1:16 (H) 11/07/2020  ? ?HIV 1 RNA Quant  ?Date Value  ?11/07/2020 <20 Copies/mL (H)  ?10/20/2019 <20 DETECTED copies/mL (A)  ?07/30/2018 <20 NOT  DETECTED copies/mL  ? ?CD4 T Cell Abs (/uL)  ?Date Value  ?11/07/2020 792  ?10/20/2019 770  ?07/30/2018 780  ? ?  ?Problem List Items Addressed This Visit   ? ?  ? High  ? HIV disease (HCC)  ?  His infection has been under excellent, long-term control.  He will continue Odefsey, get updated lab work today and follow-up in 1 year.  He received a COVID booster vaccine here today. ?  ?  ? Relevant Medications  ? emtricitabine-rilpivir-tenofovir AF (ODEFSEY) 200-25-25 MG TABS tablet  ? Other Relevant Orders  ? T-helper cells (CD4) count (not at [facility])  ? HIV-1 RNA quant-no reflex-bld  ? CBC  ? Comprehensive metabolic panel  ? RPR  ? Lipid panel  ?  ? Unprioritized  ? Anal dysplasia  ?  He followed up with his general surgeon, Dr. Verl Dicker several years ago.  He does not recall what she found about his anal dysplasia.  He does not have a follow-up visit.  I suggested that he schedule a follow-up visit with her or in the [place] area within the next year. ?  ?  ? Syphilis  ?  His RPR has remained serofast following treatment for syphilis several years ago.  He has no evidence of active infection at this time. ?  ?  ? Relevant Medications  ? emtricitabine-rilpivir-tenofovir AF (ODEFSEY) 200-25-25 MG TABS tablet  ? Tinea versicolor  ?  He uses head and shoulders shampoo regularly on his trunk and says that his tinea rash has not returned. ?  ?  ? Relevant Medications  ? emtricitabine-rilpivir-tenofovir AF (ODEFSEY) 200-25-25 MG TABS tablet  ? ? ? ? ?Cliffton Asters, MD ?[facility] Infect[facility] Disease ?[facility] ?[phone] pager   [phone] cell ?01/10/2022, 8:50 AM ? ?

## 2022-01-10 NOTE — Assessment & Plan Note (Signed)
His RPR has remained serofast following treatment for syphilis several years ago.  He has no evidence of active infection at this time. ?

## 2022-01-10 NOTE — Assessment & Plan Note (Signed)
His infection has been under excellent, long-term control.  He will continue Odefsey, get updated lab work today and follow-up in 1 year.  He received a COVID booster vaccine here today. ?

## 2022-01-10 NOTE — Assessment & Plan Note (Signed)
He uses head and shoulders shampoo regularly on his trunk and says that his tinea rash has not returned. ?

## 2022-01-10 NOTE — Assessment & Plan Note (Signed)
He followed up with his general surgeon, Dr. Verl Dicker several years ago.  He does not recall what she found about his anal dysplasia.  He does not have a follow-up visit.  I suggested that he schedule a follow-up visit with her or in the [place] area within the next year. ?

## 2022-01-12 ENCOUNTER — Encounter: Payer: Self-pay | Admitting: Internal Medicine

## 2022-01-13 LAB — LIPID PANEL
Cholesterol: 118 mg/dL (ref <200)
HDL: 36 mg/dL — ABNORMAL LOW (ref >=40)
LDL Cholesterol (Calc): 69 mg/dL (calc)
Non-HDL Cholesterol (Calc): 82 mg/dL (calc) (ref <130)
Total CHOL/HDL Ratio: 3.3 (calc) (ref <5.0)
Triglycerides: 58 mg/dL (ref <150)

## 2022-01-13 LAB — COMPREHENSIVE METABOLIC PANEL
AG Ratio: 1.7 (calc) (ref 1.0–2.5)
ALT: 22 U/L (ref 9–46)
AST: 49 U/L — ABNORMAL HIGH (ref 10–40)
Albumin: 4.3 g/dL (ref 3.6–5.1)
Alkaline phosphatase (APISO): 59 U/L (ref 36–130)
BUN/Creatinine Ratio: 14 (calc) (ref 6–22)
BUN: 19 mg/dL (ref 7–25)
CO2: 26 mmol/L (ref 20–32)
Calcium: 8.7 mg/dL (ref 8.6–10.3)
Chloride: 107 mmol/L (ref 98–110)
Creat: 1.32 mg/dL — ABNORMAL HIGH (ref 0.60–1.26)
Globulin: 2.5 g/dL (calc) (ref 1.9–3.7)
Glucose, Bld: 111 mg/dL — ABNORMAL HIGH (ref 65–99)
Potassium: 3.6 mmol/L (ref 3.5–5.3)
Sodium: 140 mmol/L (ref 135–146)
Total Bilirubin: 0.5 mg/dL (ref 0.2–1.2)
Total Protein: 6.8 g/dL (ref 6.1–8.1)

## 2022-01-13 LAB — CBC
HCT: 42.1 % (ref 38.5–50.0)
Hemoglobin: 14.1 g/dL (ref 13.2–17.1)
MCH: 30.9 pg (ref 27.0–33.0)
MCHC: 33.5 g/dL (ref 32.0–36.0)
MCV: 92.3 fL (ref 80.0–100.0)
MPV: 10.5 fL (ref 7.5–12.5)
Platelets: 242 10*3/uL (ref 140–400)
RBC: 4.56 10*6/uL (ref 4.20–5.80)
RDW: 12 % (ref 11.0–15.0)
WBC: 6.4 10*3/uL (ref 3.8–10.8)

## 2022-01-13 LAB — HIV-1 RNA QUANT-NO REFLEX-BLD
HIV 1 RNA Quant: NOT DETECTED Copies/mL
HIV-1 RNA Quant, Log: NOT DETECTED Log cps/mL

## 2022-01-13 LAB — RPR TITER: RPR Titer: 1:8 {titer} — ABNORMAL HIGH

## 2022-01-13 LAB — T-HELPER CELLS (CD4) COUNT (NOT AT ARMC)
Absolute CD4: 1101 cells/uL (ref 490–1740)
CD4 T Helper %: 43 % (ref 30–61)
Total lymphocyte count: 2548 cells/uL (ref 850–3900)

## 2022-01-13 LAB — RPR: RPR Ser Ql: REACTIVE — AB

## 2022-01-13 LAB — FLUORESCENT TREPONEMAL AB(FTA)-IGG-BLD: Fluorescent Treponemal ABS: REACTIVE — AB

## 2022-01-17 ENCOUNTER — Other Ambulatory Visit (HOSPITAL_COMMUNITY): Payer: Self-pay

## 2022-01-25 ENCOUNTER — Other Ambulatory Visit (HOSPITAL_COMMUNITY): Payer: Self-pay

## 2022-01-26 ENCOUNTER — Other Ambulatory Visit (HOSPITAL_COMMUNITY): Payer: Self-pay

## 2022-02-15 ENCOUNTER — Other Ambulatory Visit (HOSPITAL_COMMUNITY): Payer: Self-pay

## 2022-02-16 ENCOUNTER — Other Ambulatory Visit (HOSPITAL_COMMUNITY): Payer: Self-pay

## 2022-02-23 ENCOUNTER — Other Ambulatory Visit (HOSPITAL_COMMUNITY): Payer: Self-pay

## 2022-03-05 ENCOUNTER — Other Ambulatory Visit (HOSPITAL_COMMUNITY): Payer: Self-pay

## 2022-03-06 ENCOUNTER — Encounter: Payer: Self-pay | Admitting: Internal Medicine

## 2022-03-13 ENCOUNTER — Other Ambulatory Visit (HOSPITAL_COMMUNITY): Payer: Self-pay

## 2022-03-13 ENCOUNTER — Other Ambulatory Visit: Payer: Self-pay

## 2022-03-13 DIAGNOSIS — B360 Pityriasis versicolor: Secondary | ICD-10-CM

## 2022-03-13 MED ORDER — ODEFSEY 200-25-25 MG PO TABS: 1.0000 | ORAL_TABLET | Freq: Every day | ORAL | 11 refills | Status: DC

## 2022-03-13 NOTE — Progress Notes (Signed)
Rx needs to be filled through [place] since he is ADAP. Prescription resent.  ?Leatrice Jewels, RMA  ?

## 2023-01-23 ENCOUNTER — Ambulatory Visit: Payer: Self-pay | Admitting: Internal Medicine

## 2023-04-26 ENCOUNTER — Other Ambulatory Visit: Payer: Self-pay

## 2023-04-26 DIAGNOSIS — B360 Pityriasis versicolor: Secondary | ICD-10-CM

## 2023-04-26 MED ORDER — ODEFSEY 200-25-25 MG PO TABS: 1.0000 | ORAL_TABLET | Freq: Every day | ORAL | 0 refills | Status: DC

## 2023-05-15 ENCOUNTER — Encounter: Payer: Self-pay | Admitting: Infectious Diseases

## 2023-05-15 ENCOUNTER — Ambulatory Visit: Payer: BC Managed Care – PPO | Admitting: Infectious Diseases

## 2023-05-15 ENCOUNTER — Other Ambulatory Visit: Payer: Self-pay

## 2023-05-15 VITALS — BP 121/76 | HR 55 | Temp 98.0°F | Wt 163.0 lb

## 2023-05-15 DIAGNOSIS — B20 Human immunodeficiency virus [HIV] disease: Secondary | ICD-10-CM | POA: Diagnosis not present

## 2023-05-15 DIAGNOSIS — Z5181 Encounter for therapeutic drug level monitoring: Secondary | ICD-10-CM

## 2023-05-15 DIAGNOSIS — Z79899 Other long term (current) drug therapy: Secondary | ICD-10-CM | POA: Diagnosis not present

## 2023-05-15 DIAGNOSIS — Z0000 Encounter for general adult medical examination without abnormal findings: Secondary | ICD-10-CM | POA: Insufficient documentation

## 2023-05-15 DIAGNOSIS — A539 Syphilis, unspecified: Secondary | ICD-10-CM

## 2023-05-15 DIAGNOSIS — K6282 Dysplasia of anus: Secondary | ICD-10-CM

## 2023-05-15 MED ORDER — ODEFSEY 200-25-25 MG PO TABS: 1.0000 | ORAL_TABLET | Freq: Every day | ORAL | 11 refills | Status: DC

## 2023-05-15 MED ORDER — ODEFSEY 200-25-25 MG PO TABS
1.0000 | ORAL_TABLET | Freq: Every day | ORAL | 11 refills | Status: DC
Start: 2023-05-15 — End: 2023-09-04

## 2023-05-15 NOTE — Progress Notes (Signed)
[address], [place], [place], [address]                                                                  Phn. [phone]; Fax: [phone]                                                                             Date: 05/15/23  Reason for Visit: Routine HIV care.  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 He has not seen CRS yet. He has moved to [facility] area and has an appt in the ID clinic in late July. He wants to transfer care to [facility] but wants to make sure he has his meds supply before he is established. Smokes marijuana, drinks alcohol sometimes. He is sexually active with male only but none recently. Works as a bar tender. Mental state is good with no concerns for feeling sad, depressed. Declined vaccines, anal pap and STD screening. No complaints otherwise.   ROS: As stated in above HPI; all other systems were reviewed and are otherwise negative unless noted below  No reported fever / chills, night sweats, unintentional weight loss, acute visual change, odynophagia, chest pain/pressure, new or worsened SOB or WOB, nausea, vomiting, diarrhea, dysuria, GU discharge, syncope, seizures, red/hot swollen  joints, hallucinations / delusions, rashes, new allergies, unusual / excessive bleeding, swollen lymph nodes, or new hospitalizations/ED visits/Urgent Care visits since the pt was last seen.  PMH/ PSH/ FamHx / Social Hx , medications and allergies reviewed and updated as appropriate; please see corresponding tab in EHR / prior notes                                        No current outpatient medications on file prior to visit.   No current facility-administered medications on file prior to visit.    No Known Allergies  Past Medical  History:  Diagnosis Date   HIV infection (HCC)    No past surgical history on file.  Social History   Socioeconomic History   Marital status: Unknown    Spouse name: Not on file   Number of children: Not on file   Years of education: Not on file   Highest education level: Not on file  Occupational History   Not on file  Tobacco Use   Smoking status: Former    Years: 8    Types: Cigarettes    Quit date: 05/12/2016    Years since quitting: 7.0   Smokeless tobacco: Never  Substance and Sexual Activity   Alcohol use: Yes    Comment: occassional   Drug use: Yes    Frequency: 7.0 times per week    Types: Marijuana    Comment: recreational   Sexual activity: Yes    Partners: Male    Birth control/protection: Condom    Comment: accepted condoms  Other Topics Concern   Not on file  Social History Narrative   Not on file   Social Determinants of Health   Financial Resource Strain: Not on file  Food Insecurity: Not on file  Transportation Needs: Not on file  Physical Activity: Not on file  Stress: Not on file  Social Connections: Not on file  Intimate Partner Violence: Not on file   Family History  Problem Relation Age of Onset   Cancer Paternal Grandmother    Colon cancer Paternal Grandmother    Alcohol abuse Father    Heart disease Father     Vitals BP 121/76   Pulse (!) 55   Temp 98 F (36.7 C) (Oral)   Wt 163 lb (73.9 kg)    SpO2 98%   BMI 25.53 kg/m   Examination  Gen: no acute distress HEENT: [place]/AT, no scleral icterus, no pale conjunctivae, hearing normal, oral mucosa moist Neck: Supple Cardio: Regular rate and rhythm Resp: Pulmonary effort normal in room air GI: nondistended GU: Musc: Extremities: No pedal edema Skin: No rashes Neuro: grossly non focal , awake, alert and oriented * 3  Psych: Calm, cooperative  Lab Results HIV 1 RNA Quant  Date Value  01/10/2022 Not Detected Copies/mL  11/07/2020 <20 Copies/mL (H)  10/20/2019 <20 DETECTED copies/mL (A)   CD4 T Cell Abs (/uL)  Date Value  11/07/2020 792  10/20/2019 770  07/30/2018 780   No results found for: "HIV1GENOSEQ" Lab Results  Component Value Date   WBC 6.4 01/10/2022   HGB 14.1 01/10/2022   HCT 42.1 01/10/2022   MCV 92.3 01/10/2022   PLT 242 01/10/2022    Lab Results  Component Value Date   CREATININE 1.32 (H) 01/10/2022   BUN 19 01/10/2022   NA 140 01/10/2022   K 3.6 01/10/2022   CL 107 01/10/2022   CO2 26 01/10/2022   Lab Results  Component Value Date   ALT 22 01/10/2022   AST 49 (H) 01/10/2022   ALKPHOS 60 08/26/2019   BILITOT 0.5 01/10/2022    Lab Results  Component Value Date   CHOL 118 01/10/2022   TRIG 58 01/10/2022   HDL 36 (L) 01/10/2022   LDLCALC 69 01/10/2022   Lab Results  Component Value Date   HAV NON REACTIVE 09/18/2016   Lab Results  Component Value Date   HEPBSAG NEGATIVE 09/18/2016   HEPBSAB POS (A) 09/18/2016   Lab Results  Component Value Date   HCVAB NEGATIVE 09/18/2016   Lab Results  Component  Value Date   CHLAMYDIAWP Negative 11/07/2020   N Negative 11/07/2020   No results found for: "GCPROBEAPT" Lab Results  Component Value Date   QUANTGOLD NEGATIVE 09/18/2016    Health Maintenance: Immunization History  Administered Date(s) Administered   Hepatitis A, Adult 01/03/2017, 07/30/2018   Influenza, Seasonal, Injecte, Preservative Fre 11/18/2014   Influenza,inj,Quad  PF,6+ Mos 01/03/2017, 07/30/2018, 11/24/2020   Influenza-Unspecified 08/31/2013   Meningococcal Mcv4o 01/03/2017, 08/19/2018   PFIZER(Purple Top)SARS-COV-2 Vaccination 02/23/2020, 03/16/2020   PPD Test 08/03/2013   Pfizer Covid-19 Vaccine Bivalent Booster 73yrs & up 01/10/2022   Pneumococcal Conjugate-13 08/19/2018   Tdap 11/18/2014   Assessment/Plan: # HIV Well controlled  Continue Odefsey, meds refilled  Labs today  Fu in 6 months just in case he has not transferred care to [facility]     # STD Screening  Offered condoms Refused screening No concerns   #Immunization  Declined vaccines overall today   #Health maintenance H/o anal prior anal dysplasia - discussed to fu with Colorectal surgery  Dental care to be discussed   Patient's labs were reviewed as well as his previous records. Patients questions were addressed and answered. Safe sex counseling done.CIN, he has not followed up, phone number of Dr Romie Levee MD given to fu on her office   I have personally spent 41  minutes involved in face-to-face and non-face-to-face activities for this patient on the day of the visit. Professional time spent includes the following activities: Preparing to see the patient (review of tests), Obtaining and/or reviewing separately obtained history (admission/discharge record), Performing a medically appropriate examination and/or evaluation , Ordering medications/tests/procedures, referring and communicating with other health care professionals, Documenting clinical information in the EMR, Independently interpreting results (not separately reported), Communicating results to the patient/family/caregiver, Counseling and educating the patient/family/caregiver and Care coordination (not separately reported).    Electronically signed by:  Odette Fraction, MD Infectious Disease Physician [facility] [address] [place], [place]  [address] Phone: [phone]  Fax: [phone]

## 2023-05-16 LAB — T-HELPER CELLS (CD4) COUNT (NOT AT ARMC): Absolute CD4: 1135 cells/uL (ref 490–1740)

## 2023-05-18 LAB — RPR TITER: RPR Titer: 1:16 {titer} — ABNORMAL HIGH

## 2023-05-21 LAB — T PALLIDUM AB: T Pallidum Abs: POSITIVE — AB

## 2023-05-21 LAB — RPR: RPR Ser Ql: REACTIVE — AB

## 2023-05-21 LAB — COMPREHENSIVE METABOLIC PANEL
AG Ratio: 1.7 (calc) (ref 1.0–2.5)
ALT: 27 U/L (ref 9–46)
AST: 31 U/L (ref 10–40)
Albumin: 4.4 g/dL (ref 3.6–5.1)
Alkaline phosphatase (APISO): 63 U/L (ref 36–130)
BUN: 23 mg/dL (ref 7–25)
CO2: 28 mmol/L (ref 20–32)
Calcium: 9.2 mg/dL (ref 8.6–10.3)
Chloride: 107 mmol/L (ref 98–110)
Creat: 1.13 mg/dL (ref 0.60–1.26)
Globulin: 2.6 g/dL (calc) (ref 1.9–3.7)
Glucose, Bld: 96 mg/dL (ref 65–99)
Potassium: 3.7 mmol/L (ref 3.5–5.3)
Sodium: 140 mmol/L (ref 135–146)
Total Bilirubin: 0.5 mg/dL (ref 0.2–1.2)
Total Protein: 7 g/dL (ref 6.1–8.1)

## 2023-05-21 LAB — HIV RNA, RTPCR W/R GT (RTI, PI,INT)
HIV 1 RNA Quant: 41 copies/mL — ABNORMAL HIGH
HIV-1 RNA Quant, Log: 1.61 Log copies/mL — ABNORMAL HIGH

## 2023-05-21 LAB — T-HELPER CELLS (CD4) COUNT (NOT AT ARMC)
CD4 T Helper %: 35 % (ref 30–61)
Total lymphocyte count: 3261 cells/uL (ref 850–3900)

## 2023-05-21 LAB — CBC
HCT: 43.5 % (ref 38.5–50.0)
Hemoglobin: 14.6 g/dL (ref 13.2–17.1)
MCH: 30.4 pg (ref 27.0–33.0)
MCHC: 33.6 g/dL (ref 32.0–36.0)
MCV: 90.6 fL (ref 80.0–100.0)
MPV: 10.5 fL (ref 7.5–12.5)
Platelets: 194 10*3/uL (ref 140–400)
RBC: 4.8 10*6/uL (ref 4.20–5.80)
RDW: 12.2 % (ref 11.0–15.0)
WBC: 7 10*3/uL (ref 3.8–10.8)

## 2023-05-21 LAB — LIPID PANEL
Cholesterol: 117 mg/dL (ref <200)
HDL: 41 mg/dL (ref >=40)
LDL Cholesterol (Calc): 63 mg/dL (calc)
Non-HDL Cholesterol (Calc): 76 mg/dL (calc) (ref <130)
Total CHOL/HDL Ratio: 2.9 (calc) (ref <5.0)
Triglycerides: 53 mg/dL (ref <150)

## 2023-09-04 ENCOUNTER — Other Ambulatory Visit: Payer: Self-pay

## 2023-09-04 ENCOUNTER — Ambulatory Visit: Payer: BC Managed Care – PPO

## 2023-09-04 ENCOUNTER — Other Ambulatory Visit (HOSPITAL_COMMUNITY): Payer: Self-pay

## 2023-09-04 MED ORDER — ODEFSEY 200-25-25 MG PO TABS: 1.0000 | ORAL_TABLET | Freq: Every day | ORAL | 11 refills | Status: DC

## 2023-10-08 ENCOUNTER — Telehealth: Payer: Self-pay

## 2023-10-08 ENCOUNTER — Other Ambulatory Visit (HOSPITAL_COMMUNITY): Payer: Self-pay

## 2023-10-08 NOTE — Telephone Encounter (Signed)
[facility] Patient Advocate Encounter   Received notification from [facility] that prior authorization for Charlett Lango is required.   PA submitted on 10/08/23 Key  [id number] Status is pending    [facility] Clinic will continue to follow.   Clearance Coots, CPhT Specialty Pharmacy Patient [facility] for Infectious Disease Phone: [phone] Fax:  [phone]

## 2023-10-16 ENCOUNTER — Telehealth: Payer: Self-pay

## 2023-10-16 ENCOUNTER — Other Ambulatory Visit (HOSPITAL_COMMUNITY): Payer: Self-pay

## 2023-10-16 NOTE — Telephone Encounter (Signed)
[facility] Patient Advocate Encounter  Received a fax from [employer] on [address].  This medication [facility]) is covered under the patient pharmacy benefit without prior approval.   Prescription should be filled as a 90 day supply through [facility].  Clearance Coots, CPhT Specialty Pharmacy Patient [facility] for Infectious Disease Phone: [phone] Fax:  [phone]

## 2023-11-19 ENCOUNTER — Ambulatory Visit: Payer: BC Managed Care – PPO | Admitting: Infectious Diseases

## 2023-11-19 ENCOUNTER — Other Ambulatory Visit (HOSPITAL_COMMUNITY)
Admission: RE | Admit: 2023-11-19 | Discharge: 2023-11-19 | Disposition: A | Discharge status: Home / Self Care | Payer: BC Managed Care – PPO | Source: Ambulatory Visit | Attending: Infectious Diseases | Admitting: Infectious Diseases

## 2023-11-19 ENCOUNTER — Other Ambulatory Visit: Payer: Self-pay

## 2023-11-19 VITALS — BP 120/75 | HR 63 | Temp 97.9°F | Wt 167.0 lb

## 2023-11-19 DIAGNOSIS — Z113 Encounter for screening for infections with a predominantly sexual mode of transmission: Secondary | ICD-10-CM

## 2023-11-19 DIAGNOSIS — Z7185 Encounter for immunization safety counseling: Secondary | ICD-10-CM | POA: Insufficient documentation

## 2023-11-19 DIAGNOSIS — Z5181 Encounter for therapeutic drug level monitoring: Secondary | ICD-10-CM

## 2023-11-19 DIAGNOSIS — Z0000 Encounter for general adult medical examination without abnormal findings: Secondary | ICD-10-CM

## 2023-11-19 DIAGNOSIS — B20 Human immunodeficiency virus [HIV] disease: Secondary | ICD-10-CM

## 2023-11-19 NOTE — Progress Notes (Signed)
 [address], [place], [place], [address]                                                                  Phn. [phone]; Fax: [phone]                                                                             Date: 11/19/23  Reason for Visit: Routine HIV care.  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  Lab Results  Component Value Date   HIV1RNAQUANT 41 (H) 05/15/2023   Lab Results  Component Value Date   CD4TABS 792 11/07/2020   CD4TABS 770 10/20/2019   CD4TABS 780 07/30/2018   HIV h/o HIV Diagnosis in: 2012 How it was diagnosed: STI testing  How it was contracted: sexual encounter  Prior ART: complera > switched to Odefsey  in 10/02/2016 till now   Interval hx/current visit: Taking Odefsey  daily with food before going to bed. Denies any missed doses of barriers to adherence of treatment. Smokes marijuana and alcohol once in a while. Sexually active with males with protection but no consistent partner. Declined vaccines, has not followed with Colorectal surgery yet. He is following outside dental clinic and looking to switch. No complaints.   ROS: As stated in above HPI; all other systems were reviewed and are otherwise negative unless noted below  No reported fever / chills, night sweats, unintentional weight loss, acute visual change, odynophagia, chest pain/pressure, new or worsened SOB or WOB, nausea, vomiting, diarrhea, dysuria, GU discharge, syncope, seizures, red/hot swollen joints, hallucinations / delusions, rashes, new allergies, unusual / excessive bleeding, swollen lymph nodes, or new hospitalizations/ED  visits/Urgent Care visits since the pt was last seen.  PMH/ PSH/ FamHx / Social Hx , medications and allergies reviewed and updated as appropriate; please see corresponding tab in EHR / prior notes                                        Current Outpatient Medications on File Prior to Visit  Medication Sig Dispense Refill   emtricitabine -rilpivir-tenofovir  AF (ODEFSEY ) 200-25-25 MG TABS tablet Take 1 tablet by mouth daily. 30 tablet 11   No current facility-administered medications on file prior to visit.    No Known Allergies  Past Medical History:  Diagnosis Date   HIV infection (HCC)  No past surgical history on file.  Social History   Socioeconomic History   Marital status: Unknown    Spouse name: Not on file   Number of children: Not on file   Years of education: Not on file   Highest education level: Not on file  Occupational History   Not on file  Tobacco Use   Smoking status: Former    Current packs/day: 0.00    Types: Cigarettes    Start date: 05/12/2008    Quit date: 05/12/2016    Years since quitting: 7.5   Smokeless tobacco: Never  Substance and Sexual Activity   Alcohol use: Yes    Comment: occassional   Drug use: Yes    Frequency: 7.0 times per week    Types: Marijuana    Comment: recreational   Sexual activity: Yes    Partners: Male    Birth control/protection: Condom    Comment: accepted condoms  Other Topics Concern   Not on file  Social History Narrative   Not on file   Social Drivers of Health   Financial Resource Strain: Low Risk  (01/23/2023)   Received from [facility], [facility]   Overall Financial Resource Strain (CARDIA)    Difficulty of Paying Living Expenses: Not hard at all  Food Insecurity: No Food Insecurity (01/23/2023)   Received from [facility], [facility]   Hunger Vital Sign    Worried About Running Out of Food in the Last Year: Never true    Ran Out of Food in the Last Year: Never true  Transportation  Needs: No Transportation Needs (01/23/2023)   Received from [facility], [facility]   PRAPARE - Transportation    Lack of Transportation (Medical): No    Lack of Transportation (Non-Medical): No  Physical Activity: Not on file  Stress: Not on file  Social Connections: Not on file  Intimate Partner Violence: Not on file   Family History  Problem Relation Age of Onset   Cancer Paternal Grandmother    Colon cancer Paternal Grandmother    Alcohol abuse Father    Heart disease Father     Vitals BP 120/75   Pulse 63   Temp 97.9 F (36.6 C) (Oral)   Wt 167 lb (75.8 kg)   BMI 26.16 kg/m   Examination  Gen: no acute distress HEENT: [place]/AT, no scleral icterus, no pale conjunctivae, hearing normal, oral mucosa moist Neck: Supple Cardio: Regular rate and rhythm Resp: Pulmonary effort normal in room air GI: nondistended GU: Musc: Extremities: No pedal edema Skin: No rashes Neuro: grossly non focal , awake, alert and oriented * 3  Psych: Calm, cooperative  Lab Results HIV 1 RNA Quant  Date Value  05/15/2023 41 copies/mL (H)  01/10/2022 Not Detected Copies/mL  11/07/2020 <20 Copies/mL (H)   CD4 T Cell Abs (/uL)  Date Value  11/07/2020 792  10/20/2019 770  07/30/2018 780   No results found for: HIV1GENOSEQ Lab Results  Component Value Date   WBC 7.0 05/15/2023   HGB 14.6 05/15/2023   HCT 43.5 05/15/2023   MCV 90.6 05/15/2023   PLT 194 05/15/2023    Lab Results  Component Value Date   CREATININE 1.13 05/15/2023   BUN 23 05/15/2023   NA 140 05/15/2023   K 3.7 05/15/2023   CL 107 05/15/2023   CO2 28 05/15/2023   Lab Results  Component Value Date   ALT 27 05/15/2023   AST 31  05/15/2023   ALKPHOS 60 08/26/2019   BILITOT 0.5 05/15/2023    Lab Results  Component Value Date   CHOL 117 05/15/2023   TRIG 53 05/15/2023   HDL 41 05/15/2023   LDLCALC 63 05/15/2023   Lab Results  Component Value Date   HAV NON REACTIVE 09/18/2016   Lab Results   Component Value Date   HEPBSAG NEGATIVE 09/18/2016   HEPBSAB POS (A) 09/18/2016   Lab Results  Component Value Date   HCVAB NEGATIVE 09/18/2016   Lab Results  Component Value Date   CHLAMYDIAWP Negative 11/07/2020   N Negative 11/07/2020   No results found for: GCPROBEAPT Lab Results  Component Value Date   QUANTGOLD NEGATIVE 09/18/2016    Health Maintenance: Immunization History  Administered Date(s) Administered   Hepatitis A, Adult 01/03/2017, 07/30/2018   Influenza, Seasonal, Injecte, Preservative Fre 11/18/2014   Influenza,inj,Quad PF,6+ Mos 01/03/2017, 07/30/2018, 11/24/2020   Influenza-Unspecified 08/31/2013   Meningococcal Mcv4o 01/03/2017, 08/19/2018   PFIZER(Purple Top)SARS-COV-2 Vaccination 02/23/2020, 03/16/2020   PPD Test 08/03/2013   Pfizer Covid-19 Vaccine Bivalent Booster 54yrs & up 01/10/2022   Pneumococcal Conjugate-13 08/19/2018   Tdap 11/18/2014   Assessment/Plan: # HIV - Well controlled  - Continue Odefsey ,has enough refills - Labs today  - Fu in 5-6 months   # STD Screening/H/o syphilis - Offered condoms - Urine, oral and anal GC, ROR  #Immunization  - Declined vaccines overall including PCV 20, Flu and COVID  #Health maintenance - H/o anal prior anal dysplasia - Have discussed to fu with Colorectal surgery  - Follows Outside dental clinic - No statin recommended per ASCVD 2013 risk calculator AHA/ACC  Patient's labs were reviewed as well as his previous records. Patients questions were addressed and answered. Safe sex counseling done.CIN, he has not followed up, phone number of Dr Bernarda Ned MD given to fu on her office   I have personally spent 41  minutes involved in face-to-face and non-face-to-face activities for this patient on the day of the visit. Professional time spent includes the following activities: Preparing to see the patient (review of tests), Obtaining and/or reviewing separately obtained history  (admission/discharge record), Performing a medically appropriate examination and/or evaluation , Ordering medications/tests/procedures, referring and communicating with other health care professionals, Documenting clinical information in the EMR, Independently interpreting results (not separately reported), Communicating results to the patient/family/caregiver, Counseling and educating the patient/family/caregiver and Care coordination (not separately reported).   Electronically signed by:  Annalee Orem, MD Infectious Disease Physician [facility] for Infectious Disease [address] [place], [place] [address] Phone: [phone]  Fax: [phone]

## 2023-11-20 LAB — CYTOLOGY, (ORAL, ANAL, URETHRAL) ANCILLARY ONLY
Chlamydia: NEGATIVE
Chlamydia: NEGATIVE
Comment: NEGATIVE
Comment: NEGATIVE
Comment: NORMAL
Comment: NORMAL
Neisseria Gonorrhea: NEGATIVE
Neisseria Gonorrhea: NEGATIVE

## 2023-11-20 LAB — URINE CYTOLOGY ANCILLARY ONLY
Chlamydia: NEGATIVE
Comment: NEGATIVE
Comment: NORMAL
Neisseria Gonorrhea: NEGATIVE

## 2023-11-21 LAB — COMPREHENSIVE METABOLIC PANEL
AG Ratio: 1.6 (calc) (ref 1.0–2.5)
ALT: 19 U/L (ref 9–46)
AST: 25 U/L (ref 10–40)
Albumin: 4.5 g/dL (ref 3.6–5.1)
Alkaline phosphatase (APISO): 62 U/L (ref 36–130)
BUN: 20 mg/dL (ref 7–25)
CO2: 29 mmol/L (ref 20–32)
Calcium: 9.2 mg/dL (ref 8.6–10.3)
Chloride: 105 mmol/L (ref 98–110)
Creat: 1.11 mg/dL (ref 0.60–1.26)
Globulin: 2.9 g/dL (ref 1.9–3.7)
Glucose, Bld: 88 mg/dL (ref 65–99)
Potassium: 3.8 mmol/L (ref 3.5–5.3)
Sodium: 140 mmol/L (ref 135–146)
Total Bilirubin: 0.6 mg/dL (ref 0.2–1.2)
Total Protein: 7.4 g/dL (ref 6.1–8.1)

## 2023-11-21 LAB — T PALLIDUM AB: T Pallidum Abs: POSITIVE — AB

## 2023-11-21 LAB — HIV RNA, RTPCR W/R GT (RTI, PI,INT)
HIV 1 RNA Quant: 59 {copies}/mL — ABNORMAL HIGH
HIV-1 RNA Quant, Log: 1.77 {Log_copies}/mL — ABNORMAL HIGH

## 2023-11-21 LAB — RPR: RPR Ser Ql: REACTIVE — AB

## 2023-11-21 LAB — RPR TITER: RPR Titer: 1:4 {titer} — ABNORMAL HIGH

## 2023-11-25 ENCOUNTER — Telehealth: Payer: Self-pay

## 2023-11-25 NOTE — Telephone Encounter (Signed)
-----   Message from Annalee Joseph sent at 11/25/2023  7:48 AM EST ----- Please let him know his HIV RNA has slightly gone up to 59. Although not so significant but would prefer VL to be under 50. Recommend to take his ART without missing doses, I don't see any other medications in [facility] for concerns for drug drug interaction  His RPR has gone down from 1: 16 to 1: 4 - nothing to do.

## 2024-02-12 ENCOUNTER — Encounter: Payer: Self-pay | Admitting: Infectious Diseases

## 2024-04-14 ENCOUNTER — Other Ambulatory Visit: Payer: Self-pay

## 2024-04-14 ENCOUNTER — Other Ambulatory Visit (HOSPITAL_COMMUNITY): Payer: Self-pay

## 2024-04-14 ENCOUNTER — Ambulatory Visit (INDEPENDENT_AMBULATORY_CARE_PROVIDER_SITE_OTHER): Payer: BC Managed Care – PPO | Admitting: Infectious Diseases

## 2024-04-14 VITALS — BP 122/80 | HR 58 | Temp 97.8°F | Ht 66.0 in | Wt 165.0 lb

## 2024-04-14 DIAGNOSIS — Z113 Encounter for screening for infections with a predominantly sexual mode of transmission: Secondary | ICD-10-CM

## 2024-04-14 DIAGNOSIS — K6282 Dysplasia of anus: Secondary | ICD-10-CM

## 2024-04-14 DIAGNOSIS — Z79899 Other long term (current) drug therapy: Secondary | ICD-10-CM | POA: Insufficient documentation

## 2024-04-14 DIAGNOSIS — Z7185 Encounter for immunization safety counseling: Secondary | ICD-10-CM

## 2024-04-14 DIAGNOSIS — B20 Human immunodeficiency virus [HIV] disease: Secondary | ICD-10-CM | POA: Diagnosis not present

## 2024-04-14 DIAGNOSIS — Z0000 Encounter for general adult medical examination without abnormal findings: Secondary | ICD-10-CM

## 2024-04-14 MED ORDER — ODEFSEY 200-25-25 MG PO TABS: 1.0000 | ORAL_TABLET | Freq: Every day | ORAL | 11 refills | Status: AC

## 2024-04-14 NOTE — Progress Notes (Unsigned)
 [address], [place], [place], [address]                                                                  Phn. [phone]; Fax: [phone]                                                                             Date: 04/14/24  Reason for Visit: Routine HIV care.  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 He was sexually active 2 times with random partners and agreed to STD screening. He does not have a consistent partner.  He has quit smoking and denies alcohol or recreational drug use. 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 Deferred PNA vaccine later for a nurse visit. 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      Taking Odefsey  daily with food before going to bed. Denies any missed doses of barriers to adherence of treatment. Smokes marijuana and alcohol once in a while. Sexually active with males with protection but no consistent partner. Declined vaccines, has not followed with Colorectal surgery yet. He is following outside dental clinic and looking to switch. No complaints.   ROS: As stated in above HPI; all other systems were reviewed and are otherwise negative unless noted below  No reported fever / chills, night sweats, unintentional weight loss, acute visual change, odynophagia, chest pain/pressure, new or worsened SOB or WOB, nausea, vomiting,  diarrhea, dysuria, GU discharge, syncope, seizures, red/hot swollen joints, hallucinations / delusions, rashes, new allergies, unusual / excessive bleeding, swollen lymph nodes, or new hospitalizations/ED visits/Urgent Care visits since the pt was last seen.  PMH/ PSH/ FamHx / Social Hx , medications and allergies reviewed and updated as appropriate; please see corresponding tab in EHR / prior notes                                        Current Outpatient Medications on File Prior to Visit  Medication Sig Dispense Refill   emtricitabine -rilpivir-tenofovir  AF (ODEFSEY ) 200-25-25 MG TABS tablet Take 1 tablet  by mouth daily. 30 tablet 11   No current facility-administered medications on file prior to visit.    No Known Allergies  Past Medical History:  Diagnosis Date   HIV infection (HCC)    No past surgical history on file.  Social History   Socioeconomic History   Marital status: Unknown    Spouse name: Not on file   Number of children: Not on file   Years of education: Not on file   Highest education level: Not on file  Occupational History   Not on file  Tobacco Use   Smoking status: Former    Current packs/day: 0.00    Types: Cigarettes    Start date: 05/12/2008    Quit date: 05/12/2016    Years since quitting: 7.9   Smokeless tobacco: Never  Substance and Sexual Activity   Alcohol use: Yes    Comment: occassional   Drug use: Yes    Frequency: 7.0 times per week    Types: Marijuana    Comment: recreational   Sexual activity: Yes    Partners: Male    Birth control/protection: Condom    Comment: accepted condoms  Other Topics Concern   Not on file  Social History Narrative   Not on file   Social Drivers of Health   Financial Resource Strain: Low Risk  (01/23/2023)   Received from [facility], [facility]   Overall Financial Resource Strain (CARDIA)    Difficulty of Paying Living Expenses: Not hard at all  Food Insecurity: No Food Insecurity (01/23/2023)    Received from [facility], [facility]   Hunger Vital Sign    Worried About Running Out of Food in the Last Year: Never true    Ran Out of Food in the Last Year: Never true  Transportation Needs: No Transportation Needs (01/23/2023)   Received from [facility], [facility]   PRAPARE - Transportation    Lack of Transportation (Medical): No    Lack of Transportation (Non-Medical): No  Physical Activity: Not on file  Stress: Not on file  Social Connections: Not on file  Intimate Partner Violence: Not on file   Family History  Problem Relation Age of Onset   Cancer Paternal Grandmother    Colon cancer Paternal Grandmother    Alcohol abuse Father    Heart disease Father     Vitals There were no vitals taken for this visit.  Examination  Gen: no acute distress HEENT: [place]/AT, no scleral icterus, no pale conjunctivae, hearing normal, oral mucosa moist Neck: Supple Cardio: Regular rate and rhythm Resp: Pulmonary effort normal in room air GI: nondistended GU: Musc: Extremities: No pedal edema Skin: No rashes Neuro: grossly non focal , awake, alert and oriented * 3  Psych: Calm, cooperative  Lab Results HIV 1 RNA Quant  Date Value  11/19/2023 59 copies/mL (H)  05/15/2023 41 copies/mL (H)  01/10/2022 Not Detected Copies/mL   CD4 T Cell Abs (/uL)  Date Value  11/07/2020 792  10/20/2019 770  07/30/2018 780   No results found for: "HIV1GENOSEQ" Lab Results  Component Value Date   WBC 7.0 05/15/2023   HGB 14.6 05/15/2023   HCT 43.5 05/15/2023   MCV 90.6 05/15/2023   PLT 194 05/15/2023    Lab Results  Component Value Date   CREATININE 1.11 11/19/2023   BUN 20 11/19/2023   NA 140 11/19/2023   K 3.8 11/19/2023   CL 105 11/19/2023   CO2  29 11/19/2023   Lab Results  Component Value Date   ALT 19 11/19/2023   AST 25 11/19/2023   ALKPHOS 60 08/26/2019   BILITOT 0.6 11/19/2023    Lab Results  Component Value Date   CHOL 117 05/15/2023   TRIG 53  05/15/2023   HDL 41 05/15/2023   LDLCALC 63 05/15/2023   Lab Results  Component Value Date   HAV NON REACTIVE 09/18/2016   Lab Results  Component Value Date   HEPBSAG NEGATIVE 09/18/2016   HEPBSAB POS (A) 09/18/2016   Lab Results  Component Value Date   HCVAB NEGATIVE 09/18/2016   Lab Results  Component Value Date   CHLAMYDIAWP Negative 11/19/2023   CHLAMYDIAWP Negative 11/19/2023   CHLAMYDIAWP Negative 11/19/2023   N Negative 11/19/2023   N Negative 11/19/2023   N Negative 11/19/2023   No results found for: "GCPROBEAPT" Lab Results  Component Value Date   QUANTGOLD NEGATIVE 09/18/2016    Health Maintenance: Immunization History  Administered Date(s) Administered   Hepatitis A, Adult 01/03/2017, 07/30/2018   Influenza, Seasonal, Injecte, Preservative Fre 11/18/2014   Influenza,inj,Quad PF,6+ Mos 01/03/2017, 07/30/2018, 11/24/2020   Influenza-Unspecified 08/31/2013   Meningococcal Mcv4o 01/03/2017, 08/19/2018   PFIZER(Purple Top)SARS-COV-2 Vaccination 02/23/2020, 03/16/2020   PPD Test 08/03/2013   Pfizer Covid-19 Vaccine Bivalent Booster 69yrs & up 01/10/2022   Pneumococcal Conjugate-13 08/19/2018   Tdap 11/18/2014   Assessment/Plan: # HIV - Well controlled  - Continue Odefsey ,has enough refills - Labs today  - Fu in 5-6 months   # STD Screening/H/o syphilis - no concerns - Urine, oral and anal GC, ROR  #Immunization  - PCV 20 as a nurse visit as he has to go to work right after this appt  #Health maintenance - H/o anal prior anal dysplasia - new referral placed  to fu with Colorectal surgery  - Follows Outside dental clinic - Lipid panel today   Patient's labs were reviewed as well as his previous records. Patients questions were addressed and answered. Safe sex counseling done.CIN, he has not followed up, phone number of Dr Joyce Nixon MD given to fu on her office   I have personally spent 41  minutes involved in face-to-face and  non-face-to-face activities for this patient on the day of the visit. Professional time spent includes the following activities: Preparing to see the patient (review of tests), Obtaining and/or reviewing separately obtained history (admission/discharge record), Performing a medically appropriate examination and/or evaluation , Ordering medications/tests/procedures, referring and communicating with other health care professionals, Documenting clinical information in the EMR, Independently interpreting results (not separately reported), Communicating results to the patient/family/caregiver, Counseling and educating the patient/family/caregiver and Care coordination (not separately reported).   Electronically signed by:  Terre Ferri, MD Infectious Disease Physician [facility] [address] [place], [place] [address] Phone: [phone]  Fax: [phone]

## 2024-04-15 LAB — GC/CHLAMYDIA PROBE, AMP (THROAT)
Chlamydia trachomatis RNA: NOT DETECTED
Neisseria gonorrhoeae RNA: NOT DETECTED

## 2024-04-15 LAB — C. TRACHOMATIS/N. GONORRHOEAE RNA
C. trachomatis RNA, TMA: NOT DETECTED
N. gonorrhoeae RNA, TMA: NOT DETECTED

## 2024-04-15 LAB — CT/NG RNA, TMA RECTAL
Chlamydia Trachomatis RNA: NOT DETECTED
Neisseria Gonorrhoeae RNA: NOT DETECTED

## 2024-04-17 LAB — LIPID PANEL
Cholesterol: 116 mg/dL
HDL: 39 mg/dL — ABNORMAL LOW
LDL Cholesterol (Calc): 66 mg/dL
Non-HDL Cholesterol (Calc): 77 mg/dL
Total CHOL/HDL Ratio: 3 (calc)
Triglycerides: 37 mg/dL

## 2024-04-17 LAB — COMPREHENSIVE METABOLIC PANEL WITH GFR
AG Ratio: 1.6 (calc) (ref 1.0–2.5)
ALT: 23 U/L (ref 9–46)
AST: 26 U/L (ref 10–40)
Albumin: 4.1 g/dL (ref 3.6–5.1)
Alkaline phosphatase (APISO): 73 U/L (ref 36–130)
BUN: 17 mg/dL (ref 7–25)
CO2: 29 mmol/L (ref 20–32)
Calcium: 8.7 mg/dL (ref 8.6–10.3)
Chloride: 106 mmol/L (ref 98–110)
Creat: 1.1 mg/dL (ref 0.60–1.26)
Globulin: 2.6 g/dL (ref 1.9–3.7)
Glucose, Bld: 106 mg/dL — ABNORMAL HIGH (ref 65–99)
Potassium: 3.7 mmol/L (ref 3.5–5.3)
Sodium: 141 mmol/L (ref 135–146)
Total Bilirubin: 0.5 mg/dL (ref 0.2–1.2)
Total Protein: 6.7 g/dL (ref 6.1–8.1)
eGFR: 89 mL/min/{1.73_m2}

## 2024-04-17 LAB — RPR: RPR Ser Ql: REACTIVE — AB

## 2024-04-17 LAB — CBC
HCT: 44 % (ref 38.5–50.0)
Hemoglobin: 14.3 g/dL (ref 13.2–17.1)
MCH: 30.2 pg (ref 27.0–33.0)
MCHC: 32.5 g/dL (ref 32.0–36.0)
MCV: 92.8 fL (ref 80.0–100.0)
MPV: 10.8 fL (ref 7.5–12.5)
Platelets: 223 10*3/uL (ref 140–400)
RBC: 4.74 10*6/uL (ref 4.20–5.80)
RDW: 12.4 % (ref 11.0–15.0)
WBC: 7.2 10*3/uL (ref 3.8–10.8)

## 2024-04-17 LAB — HIV RNA, RTPCR W/R GT (RTI, PI,INT)
HIV 1 RNA Quant: 24 {copies}/mL — ABNORMAL HIGH
HIV-1 RNA Quant, Log: 1.38 {Log_copies}/mL — ABNORMAL HIGH

## 2024-04-17 LAB — T-HELPER CELLS (CD4) COUNT (NOT AT ARMC)
Absolute CD4: 993 {cells}/uL (ref 490–1740)
CD4 T Helper %: 33 % (ref 30–61)
Total lymphocyte count: 2980 {cells}/uL (ref 850–3900)

## 2024-04-17 LAB — T PALLIDUM AB: T Pallidum Abs: POSITIVE — AB

## 2024-04-17 LAB — RPR TITER: RPR Titer: 1:32 {titer} — ABNORMAL HIGH

## 2024-04-20 ENCOUNTER — Encounter: Payer: Self-pay | Admitting: Infectious Diseases

## 2024-04-20 ENCOUNTER — Ambulatory Visit: Payer: Self-pay | Admitting: Infectious Diseases

## 2024-04-20 NOTE — Telephone Encounter (Signed)
-----   Message from Melvina Stage sent at 04/20/2024 10:39 AM EDT ----- Please let  him know RPR titre has gone up from 1: 4 in January to 1: 32. Needs one dose of Pen G 2.4 million units IM  Remains virally suppressed.

## 2024-04-20 NOTE — Telephone Encounter (Signed)
 Attempted to call patient regarding results. Not able to reach her at this time. Requested call back. Julien Odor, RMA

## 2024-04-21 NOTE — Progress Notes (Signed)
 Patient scheduled via MyChart.   Craig Shah, BSN, RN

## 2024-04-23 ENCOUNTER — Telehealth: Payer: Self-pay

## 2024-04-23 ENCOUNTER — Other Ambulatory Visit (HOSPITAL_COMMUNITY): Payer: Self-pay

## 2024-04-23 ENCOUNTER — Other Ambulatory Visit: Payer: Self-pay

## 2024-04-23 ENCOUNTER — Ambulatory Visit (INDEPENDENT_AMBULATORY_CARE_PROVIDER_SITE_OTHER): Payer: Self-pay

## 2024-04-23 DIAGNOSIS — A539 Syphilis, unspecified: Secondary | ICD-10-CM

## 2024-04-23 MED ORDER — PENICILLIN G BENZATHINE 1200000 UNIT/2ML IM SUSY
1.2000 10*6.[IU] | PREFILLED_SYRINGE | Freq: Once | INTRAMUSCULAR | Status: AC
  Administered 2024-04-23: 1.2 10*6.[IU] via INTRAMUSCULAR

## 2024-04-23 NOTE — Telephone Encounter (Signed)
 [facility] Patient Advocate Encounter   Patient has been approved for [employer] Advancing Access Patient Assistance Program for Odefsey  from 04/16/24 to 04/16/25.  This assistance will make the patient's copay 0.00.  I have spoken with the patient.  The billing information is as follows and has been shared with [facility].  Member ID: [id number] RxBin: [id number] PCN: [id number] Group: [id number]  Patient knows to call the office with questions or concerns.  Roylene Corn, CPhT Specialty Pharmacy Patient [facility] Phone: [phone] Fax: [phone] 04/23/2024 8:46 AM

## 2024-04-23 NOTE — Progress Notes (Signed)
 Nurse Visit  Craig Shah 09-Dec-1985   No Known Allergies   Reviewed allergies with patient.   Medications administered: Bicillin 2.4 million units IM  Immunizations administered: none  Patient tolerated well.   Advised patient no sex until treatment is completed plus an additional 7 days and instructed to notify sexual partners for testing and treatment. Patient verbalized understanding and has no further questions.   Lautaro Koral, BSN, RN

## 2024-09-15 ENCOUNTER — Ambulatory Visit: Admitting: Infectious Diseases
